# Patient Record
Sex: Female | Born: 1935 | Race: White | Hispanic: No | Smoking: Never smoker
Health system: Southern US, Community
[De-identification: ages and names within clinical notes are randomized; demographics above are authoritative.]

## PROBLEM LIST (undated history)

## (undated) DIAGNOSIS — K589 Irritable bowel syndrome without diarrhea: Secondary | ICD-10-CM

## (undated) DIAGNOSIS — M419 Scoliosis, unspecified: Secondary | ICD-10-CM

## (undated) DIAGNOSIS — F329 Major depressive disorder, single episode, unspecified: Secondary | ICD-10-CM

## (undated) DIAGNOSIS — K219 Gastro-esophageal reflux disease without esophagitis: Secondary | ICD-10-CM

## (undated) DIAGNOSIS — M1990 Unspecified osteoarthritis, unspecified site: Secondary | ICD-10-CM

## (undated) DIAGNOSIS — M5137 Other intervertebral disc degeneration, lumbosacral region: Secondary | ICD-10-CM

## (undated) DIAGNOSIS — J45909 Unspecified asthma, uncomplicated: Secondary | ICD-10-CM

## (undated) DIAGNOSIS — E785 Hyperlipidemia, unspecified: Secondary | ICD-10-CM

## (undated) DIAGNOSIS — M8580 Other specified disorders of bone density and structure, unspecified site: Secondary | ICD-10-CM

## (undated) DIAGNOSIS — K449 Diaphragmatic hernia without obstruction or gangrene: Secondary | ICD-10-CM

## (undated) DIAGNOSIS — M51379 Other intervertebral disc degeneration, lumbosacral region without mention of lumbar back pain or lower extremity pain: Secondary | ICD-10-CM

## (undated) DIAGNOSIS — G709 Myoneural disorder, unspecified: Secondary | ICD-10-CM

## (undated) DIAGNOSIS — M5136 Other intervertebral disc degeneration, lumbar region: Secondary | ICD-10-CM

## (undated) DIAGNOSIS — F419 Anxiety disorder, unspecified: Secondary | ICD-10-CM

## (undated) DIAGNOSIS — H269 Unspecified cataract: Secondary | ICD-10-CM

## (undated) DIAGNOSIS — H409 Unspecified glaucoma: Secondary | ICD-10-CM

## (undated) DIAGNOSIS — G43709 Chronic migraine without aura, not intractable, without status migrainosus: Secondary | ICD-10-CM

## (undated) DIAGNOSIS — K819 Cholecystitis, unspecified: Secondary | ICD-10-CM

## (undated) DIAGNOSIS — F32A Depression, unspecified: Secondary | ICD-10-CM

## (undated) DIAGNOSIS — C801 Malignant (primary) neoplasm, unspecified: Secondary | ICD-10-CM

## (undated) HISTORY — DX: Gastro-esophageal reflux disease without esophagitis: K21.9

## (undated) HISTORY — DX: Chronic migraine without aura, not intractable, without status migrainosus: G43.709

## (undated) HISTORY — DX: Unspecified cataract: H26.9

## (undated) HISTORY — DX: Unspecified glaucoma: H40.9

## (undated) HISTORY — DX: Scoliosis, unspecified: M41.9

## (undated) HISTORY — DX: Myoneural disorder, unspecified: G70.9

## (undated) HISTORY — DX: Depression, unspecified: F32.A

## (undated) HISTORY — DX: Diaphragmatic hernia without obstruction or gangrene: K44.9

## (undated) HISTORY — DX: Other intervertebral disc degeneration, lumbosacral region: M51.37

## (undated) HISTORY — DX: Cholecystitis, unspecified: K81.9

## (undated) HISTORY — DX: Other specified disorders of bone density and structure, unspecified site: M85.80

## (undated) HISTORY — PX: EYE SURGERY: SHX253

## (undated) HISTORY — PX: TONSILECTOMY, ADENOIDECTOMY, BILATERAL MYRINGOTOMY AND TUBES: SHX2538

## (undated) HISTORY — DX: Other intervertebral disc degeneration, lumbosacral region without mention of lumbar back pain or lower extremity pain: M51.379

## (undated) HISTORY — DX: Anxiety disorder, unspecified: F41.9

## (undated) HISTORY — DX: Unspecified osteoarthritis, unspecified site: M19.90

## (undated) HISTORY — PX: LEG SKIN LESION  BIOPSY / EXCISION: SUR473

## (undated) HISTORY — DX: Major depressive disorder, single episode, unspecified: F32.9

## (undated) HISTORY — DX: Malignant (primary) neoplasm, unspecified: C80.1

## (undated) HISTORY — DX: Other intervertebral disc degeneration, lumbar region: M51.36

## (undated) HISTORY — DX: Hyperlipidemia, unspecified: E78.5

## (undated) HISTORY — DX: Unspecified asthma, uncomplicated: J45.909

## (undated) HISTORY — PX: CHOLECYSTECTOMY: SHX55

## (undated) HISTORY — DX: Irritable bowel syndrome, unspecified: K58.9

## (undated) HISTORY — PX: TRABECULECTOMY: SHX107

---

## 2013-12-23 DIAGNOSIS — F419 Anxiety disorder, unspecified: Secondary | ICD-10-CM | POA: Insufficient documentation

## 2013-12-23 DIAGNOSIS — E785 Hyperlipidemia, unspecified: Secondary | ICD-10-CM | POA: Insufficient documentation

## 2013-12-23 DIAGNOSIS — H409 Unspecified glaucoma: Secondary | ICD-10-CM | POA: Insufficient documentation

## 2013-12-23 DIAGNOSIS — J45909 Unspecified asthma, uncomplicated: Secondary | ICD-10-CM | POA: Insufficient documentation

## 2014-03-03 DIAGNOSIS — K30 Functional dyspepsia: Secondary | ICD-10-CM | POA: Insufficient documentation

## 2014-03-03 DIAGNOSIS — K589 Irritable bowel syndrome without diarrhea: Secondary | ICD-10-CM | POA: Insufficient documentation

## 2014-03-03 DIAGNOSIS — H401190 Primary open-angle glaucoma, unspecified eye, stage unspecified: Secondary | ICD-10-CM | POA: Insufficient documentation

## 2014-03-03 DIAGNOSIS — J309 Allergic rhinitis, unspecified: Secondary | ICD-10-CM | POA: Insufficient documentation

## 2014-03-03 DIAGNOSIS — K2270 Barrett's esophagus without dysplasia: Secondary | ICD-10-CM | POA: Insufficient documentation

## 2014-03-03 DIAGNOSIS — R7301 Impaired fasting glucose: Secondary | ICD-10-CM | POA: Insufficient documentation

## 2014-03-03 DIAGNOSIS — M1711 Unilateral primary osteoarthritis, right knee: Secondary | ICD-10-CM | POA: Insufficient documentation

## 2014-03-03 DIAGNOSIS — G4489 Other headache syndrome: Secondary | ICD-10-CM | POA: Insufficient documentation

## 2014-03-03 DIAGNOSIS — F32A Depression, unspecified: Secondary | ICD-10-CM | POA: Insufficient documentation

## 2014-03-03 DIAGNOSIS — G43709 Chronic migraine without aura, not intractable, without status migrainosus: Secondary | ICD-10-CM | POA: Insufficient documentation

## 2014-03-03 DIAGNOSIS — K2100 Gastro-esophageal reflux disease with esophagitis, without bleeding: Secondary | ICD-10-CM | POA: Insufficient documentation

## 2014-03-03 DIAGNOSIS — H47019 Ischemic optic neuropathy, unspecified eye: Secondary | ICD-10-CM | POA: Insufficient documentation

## 2014-03-03 DIAGNOSIS — R110 Nausea: Secondary | ICD-10-CM | POA: Insufficient documentation

## 2014-03-03 DIAGNOSIS — M5430 Sciatica, unspecified side: Secondary | ICD-10-CM | POA: Insufficient documentation

## 2015-01-04 DIAGNOSIS — H53452 Other localized visual field defect, left eye: Secondary | ICD-10-CM | POA: Insufficient documentation

## 2015-02-16 DIAGNOSIS — M4125 Other idiopathic scoliosis, thoracolumbar region: Secondary | ICD-10-CM | POA: Insufficient documentation

## 2015-02-16 DIAGNOSIS — M5136 Other intervertebral disc degeneration, lumbar region: Secondary | ICD-10-CM | POA: Insufficient documentation

## 2015-07-08 DIAGNOSIS — H353290 Exudative age-related macular degeneration, unspecified eye, stage unspecified: Secondary | ICD-10-CM | POA: Insufficient documentation

## 2016-01-03 DIAGNOSIS — H52223 Regular astigmatism, bilateral: Secondary | ICD-10-CM | POA: Insufficient documentation

## 2016-01-03 DIAGNOSIS — H16223 Keratoconjunctivitis sicca, not specified as Sjogren's, bilateral: Secondary | ICD-10-CM | POA: Insufficient documentation

## 2016-01-03 DIAGNOSIS — E119 Type 2 diabetes mellitus without complications: Secondary | ICD-10-CM | POA: Insufficient documentation

## 2016-01-03 DIAGNOSIS — H5213 Myopia, bilateral: Secondary | ICD-10-CM | POA: Insufficient documentation

## 2016-01-03 DIAGNOSIS — H524 Presbyopia: Secondary | ICD-10-CM | POA: Insufficient documentation

## 2016-02-18 DIAGNOSIS — H2513 Age-related nuclear cataract, bilateral: Secondary | ICD-10-CM | POA: Insufficient documentation

## 2018-03-03 ENCOUNTER — Encounter: Payer: Self-pay | Admitting: Internal Medicine

## 2018-03-15 ENCOUNTER — Encounter: Payer: Self-pay | Admitting: Internal Medicine

## 2018-03-15 ENCOUNTER — Encounter (INDEPENDENT_AMBULATORY_CARE_PROVIDER_SITE_OTHER): Payer: Self-pay

## 2018-03-15 ENCOUNTER — Ambulatory Visit (INDEPENDENT_AMBULATORY_CARE_PROVIDER_SITE_OTHER): Payer: Medicare Other | Admitting: Internal Medicine

## 2018-03-15 VITALS — BP 126/80 | HR 72 | Ht 65.0 in | Wt 150.1 lb

## 2018-03-15 DIAGNOSIS — Z8719 Personal history of other diseases of the digestive system: Secondary | ICD-10-CM

## 2018-03-15 DIAGNOSIS — K582 Mixed irritable bowel syndrome: Secondary | ICD-10-CM

## 2018-03-15 DIAGNOSIS — K5900 Constipation, unspecified: Secondary | ICD-10-CM | POA: Diagnosis not present

## 2018-03-15 DIAGNOSIS — K219 Gastro-esophageal reflux disease without esophagitis: Secondary | ICD-10-CM | POA: Diagnosis not present

## 2018-03-15 MED ORDER — DICYCLOMINE HCL 10 MG PO CAPS: 10.0000 mg | ORAL_CAPSULE | Freq: Four times a day (QID) | ORAL | 3 refills | Status: DC | PRN

## 2018-03-15 NOTE — Patient Instructions (Addendum)
We have sent the following medications to your pharmacy for you to pick up at your convenience:  Dicylomine  Stay on your Pantoprazole twice daily.  Begin taking 1 tablespoon of Benefiber daily, working up to 2 tablespoons.   Call back in a month to let Dr. Hilarie Fredrickson know if you are doing better.    Please follow up on   04-30-2018 at 10:00am

## 2018-03-16 ENCOUNTER — Encounter: Payer: Self-pay | Admitting: Internal Medicine

## 2018-03-16 NOTE — Progress Notes (Signed)
Patient ID: Jennifer [name], female   DOB: 01-Nov-1933, 84 y.o.   MRN: [id number] HPI: Jennifer [name] is an 84 yo female with PMH of GERD, Barrett's esophagus, HH, colonic diverticulosis, internal hemorrhoid, remote colon polyps seen to evaluate alternating constipation and diarrhea with lower abdominal cramping.  She is here alone today.  She was been seen by GI MDs earlier this year and underwent an EGD (I do not have this report).  Reported this revealed severe esophagitis felt secondary to NSAIDs.  She also had an EGD and colonoscopy in Oct 2015 (Dr. Shana Chute).  Again I do not have these records available at the time of our office meeting.  She reports that she has both constipation and diarrhea.  Her constipation seems to start first and then lead to diarrhea.  She reports that she will have hard pellet like stools in the morning and this will progress to softer and more loose stool by the afternoon.  And then she will have 3 to 4 days without a bowel movement.  Prior to bowel movement she often has lower abdominal fullness and crampy discomfort.  She has a prescription for dicyclomine but she has been rationing them as the prescription is almost out.  She is tried Tums and Alka-Seltzer without much relief.  No blood in her stool or melena.  Eating seems to make her symptoms worse.  She is using pantoprazole 40 mg twice daily and reports no heartburn or reflux symptoms.  She was having some trouble swallowing earlier in the year which led to the upper endoscopy mentioned above.  All of her dysphagia has resolved.  There is no odynophagia.  She denies nausea and vomiting.  She does report having taken Excedrin and Aleve which led to upper endoscopy with the finding of esophagitis.  This was because she had tried to stop Fioricet for her migraine with aura.  Past Medical History:  Diagnosis Date  . Anxiety   . Asthma   . Chronic migraine without aura   . Degenerative disc disease at L5-S1 level   .  Depression   . Glaucoma   . Hyperlipidemia   . Irritable bowel   . Osteoarthritis     Past Surgical History:  Procedure Laterality Date  . EYE SURGERY    . TONSILECTOMY, ADENOIDECTOMY, BILATERAL MYRINGOTOMY AND TUBES      Outpatient Medications Prior to Visit  Medication Sig Dispense Refill  . Acetaminophen (TYLENOL EXTRA STRENGTH PO) Take by mouth daily as needed.    Marland Kitchen albuterol (PROVENTIL HFA;VENTOLIN HFA) 108 (90 Base) MCG/ACT inhaler Inhale into the lungs every 6 (six) hours as needed for wheezing or shortness of breath.    . Aspirin-Acetaminophen-Caffeine (EXCEDRIN PO) Take by mouth daily as needed.    . butalbital-acetaminophen-caffeine (FIORICET, ESGIC) 50-325-40 MG tablet Take 1 tablet by mouth 2 (two) times daily as needed for headache.    . diazepam (VALIUM) 10 MG tablet Take 10 mg by mouth every 6 (six) hours as needed for anxiety.    . Latanoprost 0.005 % EMUL Apply to eye daily.    . pantoprazole (PROTONIX) 40 MG tablet Take 40 mg by mouth 2 (two) times daily.    Marland Kitchen dicyclomine (BENTYL) 10 MG capsule Take 10 mg by mouth 4 (four) times daily -  before meals and at bedtime.     No facility-administered medications prior to visit.     Allergies  Allergen Reactions  . Erythromycin     Family History  Problem Relation  Age of Onset  . Diabetes Mother   . Stroke Mother   . Heart disease Father   . Diabetes Sister   . Diabetes Brother     Social History   Tobacco Use  . Smoking status: Never Smoker  . Smokeless tobacco: Never Used  Substance Use Topics  . Alcohol use: Never    Frequency: Never  . Drug use: Never    ROS: As per history of present illness, otherwise negative  BP 126/80   Pulse 72   Ht 5\' 5"  (1.651 m)   Wt 150 lb 2 oz (68.1 kg)   BMI 24.98 kg/m  Constitutional: Well-developed and well-nourished. No distress. HEENT: Normocephalic and atraumatic.  Conjunctivae are normal.  No scleral icterus. Neck: Neck supple. Trachea  midline. Cardiovascular: Normal rate, regular rhythm and intact distal pulses. No M/R/G Pulmonary/chest: Effort normal and breath sounds normal. No wheezing, rales or rhonchi. Abdominal: Soft, nontender, nondistended. Bowel sounds active throughout. There are no masses palpable.  Extremities: no clubbing, cyanosis, or edema Neurological: Alert and oriented to person place and time. Skin: Skin is warm and dry.  Psychiatric: Normal mood and affect. Behavior is normal.  RELEVANT LABS AND IMAGING: Care [facility] from [facility].  She had a normal CMP in April 2019 with the exception of a mildly elevated carbon dioxide at 32.  Total bili was 0.7, AST 19, ALT 11, alkaline phosphatase 112  ASSESSMENT/PLAN: 84 yo female with PMH of GERD, Barrett's esophagus, HH, colonic diverticulosis, internal hemorrhoid, remote colon polyps seen to evaluate alternating constipation and diarrhea with lower abdominal cramping.  We have requested her previous upper endoscopy and colonoscopy records along with any pathology results associated.  Review of these records will be needed for making further decisions and management.  1.  Alternating constipation and diarrhea --symptoms sound like constipation predominant irritable bowel.  She is having abdominal bloating after several days without bowel movement that then leads to hard stool followed by diarrhea.  There is associated lower abdominal cramping.  It is likely that improving constipation will improve abdominal bloating, lower abdominal pain and the constipation/diarrhea cycle she is on. --Begin Benefiber 1 working to 2 tablespoons daily --May need MiraLAX therapy depending on response --Can continue dicyclomine 10 mg 3-4 times daily as needed for lower abdominal crampy pain  2. GERD with history of esophagitis --esophagitis symptoms have resolved clinically.  Need to review the EGD from earlier this year.  Continue pantoprazole 40  mg twice daily AC for now.  Reported history of Barrett's esophagus though based on a GI note from earlier this year there was no Barrett's found at endoscopy in 2015.  3.  CRC screening --age 36, review colonoscopy requested.  Remote colon polyps.  Can discuss at follow-up  Return in January 2020   Cc: PCP

## 2018-04-30 ENCOUNTER — Ambulatory Visit (HOSPITAL_COMMUNITY)
Admission: RE | Admit: 2018-04-30 | Discharge: 2018-04-30 | Disposition: A | Discharge status: Home / Self Care | Payer: Medicare Other | Source: Ambulatory Visit | Attending: Internal Medicine | Admitting: Internal Medicine

## 2018-04-30 ENCOUNTER — Encounter: Payer: Self-pay | Admitting: Internal Medicine

## 2018-04-30 ENCOUNTER — Other Ambulatory Visit (HOSPITAL_COMMUNITY)
Admission: RE | Admit: 2018-04-30 | Discharge: 2018-04-30 | Disposition: A | Discharge status: Home / Self Care | Payer: Medicare Other | Attending: Internal Medicine | Admitting: Internal Medicine

## 2018-04-30 ENCOUNTER — Encounter (HOSPITAL_COMMUNITY): Payer: Self-pay

## 2018-04-30 ENCOUNTER — Telehealth: Payer: Self-pay | Admitting: Internal Medicine

## 2018-04-30 ENCOUNTER — Ambulatory Visit (INDEPENDENT_AMBULATORY_CARE_PROVIDER_SITE_OTHER): Payer: Medicare Other | Admitting: Internal Medicine

## 2018-04-30 VITALS — BP 124/60 | HR 70 | Ht 66.0 in | Wt 147.6 lb

## 2018-04-30 DIAGNOSIS — R109 Unspecified abdominal pain: Secondary | ICD-10-CM | POA: Insufficient documentation

## 2018-04-30 DIAGNOSIS — K582 Mixed irritable bowel syndrome: Secondary | ICD-10-CM

## 2018-04-30 DIAGNOSIS — R112 Nausea with vomiting, unspecified: Secondary | ICD-10-CM

## 2018-04-30 DIAGNOSIS — R1013 Epigastric pain: Secondary | ICD-10-CM | POA: Insufficient documentation

## 2018-04-30 LAB — CBC WITH DIFFERENTIAL/PLATELET
Abs Immature Granulocytes: 0.04 10*3/uL (ref 0.00–0.07)
Basophils Absolute: 0.1 10*3/uL (ref 0.0–0.1)
Basophils Relative: 1 %
Eosinophils Absolute: 0.1 10*3/uL (ref 0.0–0.5)
Eosinophils Relative: 2 %
HCT: 45.4 % (ref 36.0–46.0)
Hemoglobin: 14.7 g/dL (ref 12.0–15.0)
Immature Granulocytes: 1 %
Lymphocytes Relative: 12 %
Lymphs Abs: 0.8 10*3/uL (ref 0.7–4.0)
MCH: 32.1 pg (ref 26.0–34.0)
MCHC: 32.4 g/dL (ref 30.0–36.0)
MCV: 99.1 fL (ref 80.0–100.0)
Monocytes Absolute: 0.8 10*3/uL (ref 0.1–1.0)
Monocytes Relative: 12 %
NRBC: 0 % (ref 0.0–0.2)
Neutro Abs: 5 10*3/uL (ref 1.7–7.7)
Neutrophils Relative %: 72 %
Platelets: 259 10*3/uL (ref 150–400)
RBC: 4.58 MIL/uL (ref 3.87–5.11)
RDW: 12.8 % (ref 11.5–15.5)
WBC: 6.8 10*3/uL (ref 4.0–10.5)

## 2018-04-30 LAB — COMPREHENSIVE METABOLIC PANEL
ALT: 9 U/L (ref 0–44)
AST: 21 U/L (ref 15–41)
Albumin: 4.5 g/dL (ref 3.5–5.0)
Alkaline Phosphatase: 88 U/L (ref 38–126)
Anion gap: 11 (ref 5–15)
BUN: 10 mg/dL (ref 8–23)
CO2: 31 mmol/L (ref 22–32)
Calcium: 9.4 mg/dL (ref 8.9–10.3)
Chloride: 102 mmol/L (ref 98–111)
Creatinine, Ser: 0.7 mg/dL (ref 0.44–1.00)
GFR calc Af Amer: >60 mL/min (ref >60)
GFR calc non Af Amer: >60 mL/min (ref >60)
Glucose, Bld: 96 mg/dL (ref 70–99)
Potassium: 4.1 mmol/L (ref 3.5–5.1)
Sodium: 144 mmol/L (ref 135–145)
Total Bilirubin: 0.6 mg/dL (ref 0.3–1.2)
Total Protein: 7.8 g/dL (ref 6.5–8.1)

## 2018-04-30 LAB — LIPASE, BLOOD: Lipase: 29 U/L (ref 11–51)

## 2018-04-30 MED ORDER — IOHEXOL 300 MG/ML  SOLN
100.0000 mL | Freq: Once | INTRAMUSCULAR | Status: AC | PRN
  Administered 2018-04-30: 100 mL via INTRAVENOUS

## 2018-04-30 MED ORDER — DICYCLOMINE HCL 20 MG PO TABS: 20.0000 mg | ORAL_TABLET | Freq: Three times a day (TID) | ORAL | 1 refills | Status: DC

## 2018-04-30 MED ORDER — SODIUM CHLORIDE (PF) 0.9 % IJ SOLN
INTRAMUSCULAR | Status: AC
  Filled 2018-04-30: qty 50

## 2018-04-30 NOTE — Progress Notes (Signed)
Subjective:    Patient ID: Jennifer [name], female    DOB: 07-29-1933, 85 y.o.   MRN: [id number]  HPI Jennifer [name] is an 85 year old female with a history of GERD, Barrett's esophagus, hiatal hernia, colonic diverticulosis, internal hemorrhoids, remote colon polyps who is seen in follow-up.  She was last seen on 03/15/2018.  She is here alone today.  Today she reports that she "cannot eat food".  About 10 days ago she decided that she would try to eat and ate a meal consisting of cabbage, potatoes and navy beans.  Some hours later she developed intense pain from her chest all the way through her lower abdomen and into her back.  Pain also seem to be more located in the right upper quadrant and right back.  Associated with just diffuse difficult for her to describe discomfort.  She tried dicyclomine x2 with no help, Alka-Seltzer with no help.  She eventually became nauseous and had vomiting of nearly her entire meal.  She found it difficult to sleep.  Symptoms abated by about 10 AM the next day.  She also tried MiraLAX without benefit.  She did not have a bowel movement during this episode thus no diarrhea.  She is not had a similar episode since but is been only eating runny oat meals and crackers primarily once per day.  She is taking pantoprazole 40 mg in the morning.  She was using Benefiber 1 tablespoon and had increased to 2 tablespoons but had noticed slightly too frequent of bowel movement.  She has backed off on this slightly and her bowel movements most recently have been fairly regular but smaller volume as she is not eating as much.  No blood in her stool or melena.  She intermittently tries dicyclomine but the 20 mg dose seems to work better than the 10 mg dose.  When describing the episode occurring 10 days ago she became quite tearful and upset.  Review of Systems As per HPI, otherwise negative  Current Medications, Allergies, Past Medical History, Past Surgical History, Family  History and Social History were reviewed in [employer] record.     Objective:   Physical Exam BP 124/60   Pulse 70   Ht 5\' 6"  (1.676 m)   Wt 147 lb 9.6 oz (67 kg)   SpO2 97%   BMI 23.82 kg/m  Constitutional: Well-developed and well-nourished. No distress. HEENT: Normocephalic and atraumatic.  Conjunctivae are normal.  No scleral icterus. Neck: Neck supple. Trachea midline. Cardiovascular: Normal rate, regular rhythm and intact distal pulses. No M/R/G Pulmonary/chest: Effort normal and breath sounds normal. No wheezing, rales or rhonchi. Abdominal: Soft, nontender, nondistended. Bowel sounds active throughout. There are no masses palpable. No hepatosplenomegaly. Extremities: no clubbing, cyanosis, or edema Neurological: Alert and oriented to person place and time. Skin: Skin is warm and dry. Psychiatric: Normal mood and affect. Behavior is normal.      Assessment & Plan:  85 year old female with a history of GERD, Barrett's esophagus, hiatal hernia, colonic diverticulosis, internal hemorrhoids, remote colon polyps who is seen in follow-up.   1.  Abdominal pain/IBS/GERD with history of esophagitis--episodic abdominal pain.  Unclear etiology.  Query gallbladder dysfunction or gallstone disease.  I recommended CBC, CMP lipase, proceed with CT scan of the abdomen pelvis with IV contrast to further evaluate this symptom.  If unremarkable may need to consider HIDA scan and upper endoscopy. --CT scan and labs as above --Continue pantoprazole 40 mg once daily --Continue Benefiber 1  heaping tablespoon daily to help regulate bowel movements and control constipation and diarrhea which she has been prone to in the past (though not an issue today) --Increase dicyclomine to 20 mg every 8 hours as needed  Further recommendations after labs and imaging 25 minutes spent with the patient today. Greater than 50% was spent in counseling and coordination of care with the  patient

## 2018-04-30 NOTE — Telephone Encounter (Signed)
Jennifer [name] from [facility] called in and advised that the pt was their for sched appt from dr. Hilarie Fredrickson. She advised that the pat did not have any lab results on the order sheet and they can not do the test. She is needing a nurse to call back soon as possible to assit in the matter. thanks

## 2018-04-30 NOTE — Patient Instructions (Addendum)
If you are age 83 or older, your body mass index should be between 23-30. Your Body mass index is 23.82 kg/m. If this is out of the aforementioned range listed, please consider follow up with your Primary Care Provider.  Your provider has requested that you go to the basement level for lab work before leaving today. Press "B" on the elevator. The lab is located at the first door on the left as you exit the elevator.  ___________________________________________________ Jennifer [name] have been scheduled for a CT scan of the abdomen and pelvis at [facility]   You are scheduled on TODAY at 4:00 pm. You should arrive 15 minutes prior to your appointment time for registration. Please follow the written instructions below on the day of your exam:  WARNING: IF YOU ARE ALLERGIC TO IODINE/X-RAY DYE, PLEASE NOTIFY RADIOLOGY IMMEDIATELY AT [phone]! YOU WILL BE GIVEN A 13 HOUR PREMEDICATION PREP.  1) Do not eat or drink anything after 12:00 pm (4 hours prior to your test) 2) You have been given 2 bottles of oral contrast to drink. The solution may taste better if refrigerated, but do NOT add ice or any other liquid to this solution. Shake well before drinking.    Drink 1 bottle of contrast @ 2:00 pm (2 hours prior to your exam)  Drink 1 bottle of contrast @ 3:00 pm (1 hour prior to your exam)  You may take any medications as prescribed with a small amount of water, if necessary. If you take any of the following medications: METFORMIN, GLUCOPHAGE, GLUCOVANCE, AVANDAMET, RIOMET, FORTAMET, [place] MET, JANUMET, GLUMETZA or METAGLIP, you MAY be asked to HOLD this medication 48 hours AFTER the exam.  The purpose of you drinking the oral contrast is to aid in the visualization of your intestinal tract. The contrast solution may cause some diarrhea. Depending on your individual set of symptoms, you may also receive an intravenous injection of x-ray contrast/dye. Plan on being at  [facility] for 30 minutes or longer, depending on the type of exam you are having performed.  This test typically takes 30-45 minutes to complete.  If you have any questions regarding your exam or if you need to reschedule, you may call the CT department at [phone] between the hours of 8:00 am and 5:00 pm, Monday-Friday.  _______________________________________________________ We have sent the following medications to your pharmacy for you to pick up at your convenience:  Dicyclomine 20 mg every 8 hours

## 2018-04-30 NOTE — Telephone Encounter (Signed)
I spoke to Jennifer [name] and the pt had not gotten labs as requested when leaving our office.  She was taken to the hospital lab and had the labs drawn and will be getting her test shortly.

## 2018-05-04 ENCOUNTER — Telehealth: Payer: Self-pay | Admitting: Internal Medicine

## 2018-05-04 NOTE — Telephone Encounter (Signed)
Pt called stating that [facility] has not called her yet to schedule appointment. Pt said that she spoke with [facility] yesterday and was told that she would received a call today.

## 2018-05-04 NOTE — Telephone Encounter (Signed)
[facility] has called pt and are waiting on her to call them back to see if she can make the appt on 05/06/18 @9am  with Dr. Dalbert Batman.

## 2018-05-04 NOTE — Telephone Encounter (Signed)
Sent message to [facility] to check on referral.

## 2018-05-04 NOTE — Telephone Encounter (Signed)
Pt called again regarding this message. I let her know that we are working on this. She stated that [facility] wanted her to have test this week, pt states that her son can bring her to appt this week but if it is not this week then it will have to be postponed for long time.

## 2018-05-07 ENCOUNTER — Ambulatory Visit: Payer: Self-pay | Admitting: Surgery

## 2018-05-07 NOTE — H&P (View-Only) (Signed)
Surgical H&P  CC: abdominal pain  HPI:  This is a 85 year old woman with a history of GERD, Barrett's esophagus, hiatal hernia, colonic diverticulosis, hemorrhoids and remote colon polyps who is referred by Dr. Hilarie Fredrickson for consideration of cholecystectomy. She has numerous gastroenterological complaints which appear to be chronic, and states that for the last 1.5 years she has had intermittent bouts of pain echo from her sternum all the way down to her lower abdomen related to eating larger meals.  Recently she has had more frequent and longer lasting attacks with postprandial epigastric and lower chest pain as well as right upper quadrant pain associated with nausea.  She is tried Bentyl, Alka-Seltzer, improving her bowel regimen, but nothing has alleviated the pain, the last attack lasted throughout the evening and until 10:00 the next day, having started around 7 PM on her last meal was around 3 PM.  She also complains of a feeling like she can eat because food stacks up in her esophagus. She does take pantoprazole 40 mg daily, does use a fiber supplement and noticed that her bowel movements have been fairly regularly, no diarrhea, melena or hematochezia. She underwent CMP and CBC on January 10 both of which were within normal limits. She had a CT scan the same day that does show cholelithiasis with mild diffuse gallbladder wall thickening suspicious for cholecystitis although there was no stranding or secondary signs of cholecystitis.  No biliary ductal dilatation.  She was noted to have colonic diverticulosis without evidence of diverticulitis. No prior abdominal surgery. Denies any history of heart or lung disease besides mild asthma.  Allergies  Allergen Reactions  . Erythromycin     Past Medical History:  Diagnosis Date  . Anxiety   . Asthma   . Chronic migraine without aura   . Degenerative disc disease at L5-S1 level   . Depression   . Glaucoma   . Hyperlipidemia   . Irritable bowel    . Osteoarthritis     Past Surgical History:  Procedure Laterality Date  . EYE SURGERY    . TONSILECTOMY, ADENOIDECTOMY, BILATERAL MYRINGOTOMY AND TUBES      Family History  Problem Relation Age of Onset  . Diabetes Mother   . Stroke Mother   . Heart disease Father   . Diabetes Sister   . Diabetes Brother     Social History   Socioeconomic History  . Marital status: Widowed    Spouse name: Not on file  . Number of children: 1  . Years of education: Not on file  . Highest education level: Not on file  Occupational History  . Not on file  Social Needs  . Financial resource strain: Not on file  . Food insecurity:    Worry: Not on file    Inability: Not on file  . Transportation needs:    Medical: Not on file    Non-medical: Not on file  Tobacco Use  . Smoking status: Never Smoker  . Smokeless tobacco: Never Used  Substance and Sexual Activity  . Alcohol use: Never    Frequency: Never  . Drug use: Never  . Sexual activity: Not on file  Lifestyle  . Physical activity:    Days per week: Not on file    Minutes per session: Not on file  . Stress: Not on file  Relationships  . Social connections:    Talks on phone: Not on file    Gets together: Not on file    Attends  religious service: Not on file    Active member of club or organization: Not on file    Attends meetings of clubs or organizations: Not on file    Relationship status: Not on file  Other Topics Concern  . Not on file  Social History Narrative  . Not on file    Current Outpatient Medications on File Prior to Visit  Medication Sig Dispense Refill  . Acetaminophen (TYLENOL EXTRA STRENGTH PO) Take by mouth daily as needed.    Marland Kitchen albuterol (PROVENTIL HFA;VENTOLIN HFA) 108 (90 Base) MCG/ACT inhaler Inhale into the lungs every 6 (six) hours as needed for wheezing or shortness of breath.    . Aspirin-Acetaminophen-Caffeine (EXCEDRIN PO) Take by mouth daily as needed.    .  butalbital-acetaminophen-caffeine (FIORICET, ESGIC) 50-325-40 MG tablet Take 1 tablet by mouth 2 (two) times daily as needed for headache.    . diazepam (VALIUM) 10 MG tablet Take 10 mg by mouth every 6 (six) hours as needed for anxiety.    . dicyclomine (BENTYL) 20 MG tablet Take 1 tablet (20 mg total) by mouth every 8 (eight) hours. 90 tablet 1  . Latanoprost 0.005 % EMUL Apply to eye daily.    . pantoprazole (PROTONIX) 40 MG tablet Take 40 mg by mouth 2 (two) times daily.     No current facility-administered medications on file prior to visit.     Review of Systems: a complete, 10pt review of systems was completed with pertinent positives and negatives as documented in the HPI  Physical Exam: There were no vitals filed for this visit. Gen: A&Ox3, no distress. Her son is with her here today. Head: normocephalic, atraumatic Eyes: extraocular motions intact, anicteric.  Neck: supple without mass or thyromegaly Chest: unlabored respirations, symmetrical air entry, clear bilaterally   Cardiovascular: RRR with palpable distal pulses, no pedal edema Abdomen: soft, nondistended, nontender. No mass or organomegaly.  Extremities: warm, without edema, no deformities  Neuro: grossly intact Psych: appropriate mood and affect, normal insight  Skin: warm and dry   CBC Latest Ref Rng & Units 04/30/2018  WBC 4.0 - 10.5 K/uL 6.8  Hemoglobin 12.0 - 15.0 g/dL 14.7  Hematocrit 36.0 - 46.0 % 45.4  Platelets 150 - 400 K/uL 259    CMP Latest Ref Rng & Units 04/30/2018  Glucose 70 - 99 mg/dL 96  BUN 8 - 23 mg/dL 10  Creatinine 0.44 - 1.00 mg/dL 0.70  Sodium 135 - 145 mmol/L 144  Potassium 3.5 - 5.1 mmol/L 4.1  Chloride 98 - 111 mmol/L 102  CO2 22 - 32 mmol/L 31  Calcium 8.9 - 10.3 mg/dL 9.4  Total Protein 6.5 - 8.1 g/dL 7.8  Total Bilirubin 0.3 - 1.2 mg/dL 0.6  Alkaline Phos 38 - 126 U/L 88  AST 15 - 41 U/L 21  ALT 0 - 44 U/L 9    No results found for: INR, PROTIME  Imaging: CT  04/30/17: CLINICAL DATA:  Worsening generalized abdominal pain and nausea and vomiting for 3 months.  EXAM: CT ABDOMEN AND PELVIS WITH CONTRAST  TECHNIQUE: Multidetector CT imaging of the abdomen and pelvis was performed using the standard protocol following bolus administration of intravenous contrast.  CONTRAST:  17mL OMNIPAQUE IOHEXOL 300 MG/ML  SOLN  COMPARISON:  None.  FINDINGS: Lower Chest: No acute findings.  Hepatobiliary: No hepatic masses identified. A few tiny sub-cm cysts are seen in the left lobe.  A sub-cm gallstone is seen in the gallbladder neck. The gallbladder is mildly  distended and shows mild diffuse wall thickening, although there is no evidence of pericholecystic inflammatory changes or fluid. No evidence of biliary ductal dilatation.  Pancreas:  No mass or inflammatory changes.  Spleen: Within normal limits in size. A few tiny splenic cysts are noted.  Adrenals/Urinary Tract: No masses identified. No evidence of hydronephrosis.  Stomach/Bowel: Diverticulosis is seen mainly involving the sigmoid colon, however there is no evidence of diverticulitis.  Vascular/Lymphatic: No pathologically enlarged lymph nodes. No abdominal aortic aneurysm. Aortic atherosclerosis.  Reproductive:  No mass or other significant abnormality.  Other:  None.  Musculoskeletal: No suspicious bone lesions identified. Lumbar degenerative spondylosis and levoscoliosis noted.  IMPRESSION: 1. Cholelithiasis and mild diffuse gallbladder wall thickening, suspicious for cholecystitis. Consider nuclear medicine hepatobiliary scan for further evaluation if clinically warranted. 2. No evidence of biliary ductal dilatation. 3. Colonic diverticulosis, without radiographic evidence of diverticulitis.   Electronically Signed   By: Earle Gell M.D.   On: 04/30/2018 17:18   A/P: biliary colic versus cholecystitis. We had a very long discussion today regarding  her multiple GI complaints.  I do suspect that the postprandial pain originates from a biliary etiology, although I did inform the patient that there is significant likelihood that she will continue to have issues with her reflux and esophageal problems as well as her irritable bowel syndrome and I would not expect these to change significantly following, her surgery. I recommend proceeding with laparoscopic cholecystectomy with possible cholangiogram. Discussed risks of surgery including bleeding, pain, scarring, intraabdominal injury specifically to the common bile duct and sequelae, conversion to open surgery, blood clot, pneumonia, heart attack, stroke, failure to resolve symptoms, etc. We discussed the alternative of nonoperative management with a low-fat diet although it sounds like she is essentially are doing this and continues to struggle with symptoms. Questions welcomed and answered. She would like to proceed with surgery    Romana Juniper, MD [facility], [place] Pager [phone]

## 2018-05-07 NOTE — Patient Instructions (Addendum)
Niti Leisure  05/07/2018   Your procedure is scheduled on: 05-11-2018  Report to [facility] Main  Entrance   Report to admitting at 1215 PM    Call this number if you have problems the morning of surgery [phone]    Remember: Do not eat food:After Midnight. MAY HAVE CLEAR LIQUIDS FROM MIDNIGHT UNTIL 815 AM DAY OF SURGERY. NOTHING BY MOUTH AFTER 815 AM DAY OF SURGERY. BRUSH YOUR TEETH MORNING OF SURGERY AND RINSE YOUR MOUTH OUT, NO CHEWING GUM CANDY OR MINTS.     CLEAR LIQUID DIET   Foods Allowed                                                                     Foods Excluded  Coffee and tea, regular and decaf                             liquids that you cannot  Plain Jell-O in any flavor                                             see through such as: Fruit ices (not with fruit pulp)                                     milk, soups, orange juice  Iced Popsicles                                    All solid food Carbonated beverages, regular and diet                                    Cranberry, grape and apple juices Sports drinks like Gatorade Lightly seasoned clear broth or consume(fat free) Sugar, honey syrup  Sample Menu Breakfast                                Lunch                                     Supper Cranberry juice                    Beef broth                            Chicken broth Jell-O                                     Grape juice  Apple juice Coffee or tea                        Jell-O                                      Popsicle                                                Coffee or tea                        Coffee or tea  _____________________________________________________________________     Take these medicines the morning of surgery with A SIP OF WATER: DIAZEPAM (VALIUM), ALBUTEROL INHALER IF NEEDED AND BRING INHALER, PANTAPRAZOLE (PROTONIX)                               You may not  have any metal on your body including hair pins and              piercings  Do not wear jewelry, make-up, lotions, powders or perfumes, deodorant             Do not wear nail polish.  Do not shave  48 hours prior to surgery.                 Do not bring valuables to the hospital. [place].  Contacts, dentures or bridgework may not be worn into surgery.  Leave suitcase in the car. After surgery it may be brought to your room.     Patients discharged the day of surgery will not be allowed to drive home. IF YOU ARE HAVING SURGERY AND GOING HOME THE SAME DAY, YOU MUST HAVE AN ADULT TO DRIVE YOU HOME AND BE WITH YOU FOR 24 HOURS. YOU MAY GO HOME BY TAXI OR UBER OR ORTHERWISE, BUT AN ADULT MUST ACCOMPANY YOU HOME AND STAY WITH YOU FOR 24 HOURS.  Name and phone number of your driver:  Special Instructions: N/A              Please read over the following fact sheets you were given: _____________________________________________________________________  [facility] - Preparing for Surgery Before surgery, you can play an important role.  Because skin is not sterile, your skin needs to be as free of germs as possible.  You can reduce the number of germs on your skin by washing with CHG (chlorahexidine gluconate) soap before surgery.  CHG is an antiseptic cleaner which kills germs and bonds with the skin to continue killing germs even after washing. Please DO NOT use if you have an allergy to CHG or antibacterial soaps.  If your skin becomes reddened/irritated stop using the CHG and inform your nurse when you arrive at Short Stay. Do not shave (including legs and underarms) for at least 48 hours prior to the first CHG shower.  You may shave your face/neck. Please follow these instructions carefully:  1.  Shower with CHG Soap the night before surgery and the  morning of Surgery.  2.  If you  choose to wash your hair, wash your hair first as usual with your   normal  shampoo.  3.  After you shampoo, rinse your hair and body thoroughly to remove the  shampoo.                           4.  Use CHG as you would any other liquid soap.  You can apply chg directly  to the skin and wash                       Gently with a scrungie or clean washcloth.  5.  Apply the CHG Soap to your body ONLY FROM THE NECK DOWN.   Do not use on face/ open                           Wound or open sores. Avoid contact with eyes, ears mouth and genitals (private parts).                       Wash face,  Genitals (private parts) with your normal soap.             6.  Wash thoroughly, paying special attention to the area where your surgery  will be performed.  7.  Thoroughly rinse your body with warm water from the neck down.  8.  DO NOT shower/wash with your normal soap after using and rinsing off  the CHG Soap.                9.  Pat yourself dry with a clean towel.            10.  Wear clean pajamas.            11.  Place clean sheets on your bed the night of your first shower and do not  sleep with pets. Day of Surgery : Do not apply any lotions/deodorants the morning of surgery.  Please wear clean clothes to the hospital/surgery center.  FAILURE TO FOLLOW THESE INSTRUCTIONS MAY RESULT IN THE CANCELLATION OF YOUR SURGERY PATIENT SIGNATURE_________________________________  NURSE SIGNATURE__________________________________  ________________________________________________________________________

## 2018-05-07 NOTE — H&P (Signed)
Surgical H&P  CC: abdominal pain  HPI:  This is a 85 year old woman with a history of GERD, Barrett's esophagus, hiatal hernia, colonic diverticulosis, hemorrhoids and remote colon polyps who is referred by Dr. Hilarie Fredrickson for consideration of cholecystectomy. She has numerous gastroenterological complaints which appear to be chronic, and states that for the last 1.5 years she has had intermittent bouts of pain echo from her sternum all the way down to her lower abdomen related to eating larger meals.  Recently she has had more frequent and longer lasting attacks with postprandial epigastric and lower chest pain as well as right upper quadrant pain associated with nausea.  She is tried Bentyl, Alka-Seltzer, improving her bowel regimen, but nothing has alleviated the pain, the last attack lasted throughout the evening and until 10:00 the next day, having started around 7 PM on her last meal was around 3 PM.  She also complains of a feeling like she can eat because food stacks up in her esophagus. She does take pantoprazole 40 mg daily, does use a fiber supplement and noticed that her bowel movements have been fairly regularly, no diarrhea, melena or hematochezia. She underwent CMP and CBC on January 10 both of which were within normal limits. She had a CT scan the same day that does show cholelithiasis with mild diffuse gallbladder wall thickening suspicious for cholecystitis although there was no stranding or secondary signs of cholecystitis.  No biliary ductal dilatation.  She was noted to have colonic diverticulosis without evidence of diverticulitis. No prior abdominal surgery. Denies any history of heart or lung disease besides mild asthma.  Allergies  Allergen Reactions  . Erythromycin     Past Medical History:  Diagnosis Date  . Anxiety   . Asthma   . Chronic migraine without aura   . Degenerative disc disease at L5-S1 level   . Depression   . Glaucoma   . Hyperlipidemia   . Irritable bowel    . Osteoarthritis     Past Surgical History:  Procedure Laterality Date  . EYE SURGERY    . TONSILECTOMY, ADENOIDECTOMY, BILATERAL MYRINGOTOMY AND TUBES      Family History  Problem Relation Age of Onset  . Diabetes Mother   . Stroke Mother   . Heart disease Father   . Diabetes Sister   . Diabetes Brother     Social History   Socioeconomic History  . Marital status: Widowed    Spouse name: Not on file  . Number of children: 1  . Years of education: Not on file  . Highest education level: Not on file  Occupational History  . Not on file  Social Needs  . Financial resource strain: Not on file  . Food insecurity:    Worry: Not on file    Inability: Not on file  . Transportation needs:    Medical: Not on file    Non-medical: Not on file  Tobacco Use  . Smoking status: Never Smoker  . Smokeless tobacco: Never Used  Substance and Sexual Activity  . Alcohol use: Never    Frequency: Never  . Drug use: Never  . Sexual activity: Not on file  Lifestyle  . Physical activity:    Days per week: Not on file    Minutes per session: Not on file  . Stress: Not on file  Relationships  . Social connections:    Talks on phone: Not on file    Gets together: Not on file    Attends  religious service: Not on file    Active member of club or organization: Not on file    Attends meetings of clubs or organizations: Not on file    Relationship status: Not on file  Other Topics Concern  . Not on file  Social History Narrative  . Not on file    Current Outpatient Medications on File Prior to Visit  Medication Sig Dispense Refill  . Acetaminophen (TYLENOL EXTRA STRENGTH PO) Take by mouth daily as needed.    Marland Kitchen albuterol (PROVENTIL HFA;VENTOLIN HFA) 108 (90 Base) MCG/ACT inhaler Inhale into the lungs every 6 (six) hours as needed for wheezing or shortness of breath.    . Aspirin-Acetaminophen-Caffeine (EXCEDRIN PO) Take by mouth daily as needed.    .  butalbital-acetaminophen-caffeine (FIORICET, ESGIC) 50-325-40 MG tablet Take 1 tablet by mouth 2 (two) times daily as needed for headache.    . diazepam (VALIUM) 10 MG tablet Take 10 mg by mouth every 6 (six) hours as needed for anxiety.    . dicyclomine (BENTYL) 20 MG tablet Take 1 tablet (20 mg total) by mouth every 8 (eight) hours. 90 tablet 1  . Latanoprost 0.005 % EMUL Apply to eye daily.    . pantoprazole (PROTONIX) 40 MG tablet Take 40 mg by mouth 2 (two) times daily.     No current facility-administered medications on file prior to visit.     Review of Systems: a complete, 10pt review of systems was completed with pertinent positives and negatives as documented in the HPI  Physical Exam: There were no vitals filed for this visit. Gen: A&Ox3, no distress. Her son is with her here today. Head: normocephalic, atraumatic Eyes: extraocular motions intact, anicteric.  Neck: supple without mass or thyromegaly Chest: unlabored respirations, symmetrical air entry, clear bilaterally   Cardiovascular: RRR with palpable distal pulses, no pedal edema Abdomen: soft, nondistended, nontender. No mass or organomegaly.  Extremities: warm, without edema, no deformities  Neuro: grossly intact Psych: appropriate mood and affect, normal insight  Skin: warm and dry   CBC Latest Ref Rng & Units 04/30/2018  WBC 4.0 - 10.5 K/uL 6.8  Hemoglobin 12.0 - 15.0 g/dL 14.7  Hematocrit 36.0 - 46.0 % 45.4  Platelets 150 - 400 K/uL 259    CMP Latest Ref Rng & Units 04/30/2018  Glucose 70 - 99 mg/dL 96  BUN 8 - 23 mg/dL 10  Creatinine 0.44 - 1.00 mg/dL 0.70  Sodium 135 - 145 mmol/L 144  Potassium 3.5 - 5.1 mmol/L 4.1  Chloride 98 - 111 mmol/L 102  CO2 22 - 32 mmol/L 31  Calcium 8.9 - 10.3 mg/dL 9.4  Total Protein 6.5 - 8.1 g/dL 7.8  Total Bilirubin 0.3 - 1.2 mg/dL 0.6  Alkaline Phos 38 - 126 U/L 88  AST 15 - 41 U/L 21  ALT 0 - 44 U/L 9    No results found for: INR, PROTIME  Imaging: CT  04/30/17: CLINICAL DATA:  Worsening generalized abdominal pain and nausea and vomiting for 3 months.  EXAM: CT ABDOMEN AND PELVIS WITH CONTRAST  TECHNIQUE: Multidetector CT imaging of the abdomen and pelvis was performed using the standard protocol following bolus administration of intravenous contrast.  CONTRAST:  116mL OMNIPAQUE IOHEXOL 300 MG/ML  SOLN  COMPARISON:  None.  FINDINGS: Lower Chest: No acute findings.  Hepatobiliary: No hepatic masses identified. A few tiny sub-cm cysts are seen in the left lobe.  A sub-cm gallstone is seen in the gallbladder neck. The gallbladder is mildly  distended and shows mild diffuse wall thickening, although there is no evidence of pericholecystic inflammatory changes or fluid. No evidence of biliary ductal dilatation.  Pancreas:  No mass or inflammatory changes.  Spleen: Within normal limits in size. A few tiny splenic cysts are noted.  Adrenals/Urinary Tract: No masses identified. No evidence of hydronephrosis.  Stomach/Bowel: Diverticulosis is seen mainly involving the sigmoid colon, however there is no evidence of diverticulitis.  Vascular/Lymphatic: No pathologically enlarged lymph nodes. No abdominal aortic aneurysm. Aortic atherosclerosis.  Reproductive:  No mass or other significant abnormality.  Other:  None.  Musculoskeletal: No suspicious bone lesions identified. Lumbar degenerative spondylosis and levoscoliosis noted.  IMPRESSION: 1. Cholelithiasis and mild diffuse gallbladder wall thickening, suspicious for cholecystitis. Consider nuclear medicine hepatobiliary scan for further evaluation if clinically warranted. 2. No evidence of biliary ductal dilatation. 3. Colonic diverticulosis, without radiographic evidence of diverticulitis.   Electronically Signed   By: Earle Gell M.D.   On: 04/30/2018 17:18   A/P: biliary colic versus cholecystitis. We had a very long discussion today regarding  her multiple GI complaints.  I do suspect that the postprandial pain originates from a biliary etiology, although I did inform the patient that there is significant likelihood that she will continue to have issues with her reflux and esophageal problems as well as her irritable bowel syndrome and I would not expect these to change significantly following, her surgery. I recommend proceeding with laparoscopic cholecystectomy with possible cholangiogram. Discussed risks of surgery including bleeding, pain, scarring, intraabdominal injury specifically to the common bile duct and sequelae, conversion to open surgery, blood clot, pneumonia, heart attack, stroke, failure to resolve symptoms, etc. We discussed the alternative of nonoperative management with a low-fat diet although it sounds like she is essentially are doing this and continues to struggle with symptoms. Questions welcomed and answered. She would like to proceed with surgery    Romana Juniper, MD [facility], [place] Pager [phone]

## 2018-05-10 ENCOUNTER — Other Ambulatory Visit: Payer: Self-pay

## 2018-05-10 ENCOUNTER — Encounter (HOSPITAL_COMMUNITY): Payer: Self-pay

## 2018-05-10 ENCOUNTER — Encounter (HOSPITAL_COMMUNITY)
Admission: RE | Admit: 2018-05-10 | Discharge: 2018-05-10 | Disposition: A | Discharge status: Home / Self Care | Payer: Medicare Other | Source: Ambulatory Visit | Attending: Surgery | Admitting: Surgery

## 2018-05-10 ENCOUNTER — Ambulatory Visit (HOSPITAL_COMMUNITY)
Admission: RE | Admit: 2018-05-10 | Discharge: 2018-05-10 | Disposition: A | Discharge status: Home / Self Care | Payer: Medicare Other | Source: Ambulatory Visit | Attending: Surgery | Admitting: Surgery

## 2018-05-10 DIAGNOSIS — Z01810 Encounter for preprocedural cardiovascular examination: Secondary | ICD-10-CM | POA: Diagnosis not present

## 2018-05-10 MED ORDER — CHLORHEXIDINE GLUCONATE 4 % EX LIQD
60.0000 mL | Freq: Once | CUTANEOUS | Status: DC
  Filled 2018-05-10: qty 60

## 2018-05-10 MED ORDER — BUPIVACAINE LIPOSOME 1.3 % IJ SUSP
20.0000 mL | Freq: Once | INTRAMUSCULAR | Status: DC
  Filled 2018-05-10: qty 20

## 2018-05-10 NOTE — Progress Notes (Signed)
04/30/2018- noted in Epic- CBC w/diff., CMP

## 2018-05-11 ENCOUNTER — Ambulatory Visit (HOSPITAL_COMMUNITY)
Admission: RE | Admit: 2018-05-11 | Discharge: 2018-05-11 | Disposition: A | Discharge status: Home / Self Care | Payer: Medicare Other | Source: Ambulatory Visit | Attending: Surgery | Admitting: Surgery

## 2018-05-11 ENCOUNTER — Ambulatory Visit (HOSPITAL_COMMUNITY): Payer: Medicare Other | Admitting: Anesthesiology

## 2018-05-11 ENCOUNTER — Encounter (HOSPITAL_COMMUNITY)
Admission: RE | Disposition: A | Discharge status: Home / Self Care | Payer: Self-pay | Source: Ambulatory Visit | Attending: Surgery

## 2018-05-11 ENCOUNTER — Encounter (HOSPITAL_COMMUNITY): Payer: Self-pay | Admitting: *Deleted

## 2018-05-11 DIAGNOSIS — G43709 Chronic migraine without aura, not intractable, without status migrainosus: Secondary | ICD-10-CM | POA: Insufficient documentation

## 2018-05-11 DIAGNOSIS — H409 Unspecified glaucoma: Secondary | ICD-10-CM | POA: Insufficient documentation

## 2018-05-11 DIAGNOSIS — Z79899 Other long term (current) drug therapy: Secondary | ICD-10-CM | POA: Insufficient documentation

## 2018-05-11 DIAGNOSIS — Z881 Allergy status to other antibiotic agents status: Secondary | ICD-10-CM | POA: Insufficient documentation

## 2018-05-11 DIAGNOSIS — F419 Anxiety disorder, unspecified: Secondary | ICD-10-CM | POA: Insufficient documentation

## 2018-05-11 DIAGNOSIS — K589 Irritable bowel syndrome without diarrhea: Secondary | ICD-10-CM | POA: Diagnosis not present

## 2018-05-11 DIAGNOSIS — K219 Gastro-esophageal reflux disease without esophagitis: Secondary | ICD-10-CM | POA: Insufficient documentation

## 2018-05-11 DIAGNOSIS — J45909 Unspecified asthma, uncomplicated: Secondary | ICD-10-CM | POA: Insufficient documentation

## 2018-05-11 DIAGNOSIS — K8010 Calculus of gallbladder with chronic cholecystitis without obstruction: Secondary | ICD-10-CM | POA: Diagnosis not present

## 2018-05-11 DIAGNOSIS — K8050 Calculus of bile duct without cholangitis or cholecystitis without obstruction: Secondary | ICD-10-CM | POA: Diagnosis present

## 2018-05-11 SURGERY — CHOLECYSTECTOMY, ROBOT-ASSISTED, LAPAROSCOPIC
Anesthesia: General | Site: Abdomen

## 2018-05-11 MED ORDER — BUPIVACAINE-EPINEPHRINE 0.25% -1:200000 IJ SOLN
INTRAMUSCULAR | Status: DC | PRN
  Administered 2018-05-11: 30 mL

## 2018-05-11 MED ORDER — FENTANYL CITRATE (PF) 100 MCG/2ML IJ SOLN
INTRAMUSCULAR | Status: DC | PRN
  Administered 2018-05-11: 50 ug via INTRAVENOUS
  Administered 2018-05-11: 100 ug via INTRAVENOUS
  Administered 2018-05-11: 50 ug via INTRAVENOUS

## 2018-05-11 MED ORDER — INDOCYANINE GREEN 25 MG IV SOLR
2.5000 mg | Freq: Once | INTRAVENOUS | Status: AC
  Administered 2018-05-11: 2.5 mg via INTRAVENOUS
  Filled 2018-05-11: qty 25

## 2018-05-11 MED ORDER — DOCUSATE SODIUM 100 MG PO CAPS: 100.0000 mg | ORAL_CAPSULE | Freq: Two times a day (BID) | ORAL | 0 refills | Status: AC

## 2018-05-11 MED ORDER — DEXAMETHASONE SODIUM PHOSPHATE 10 MG/ML IJ SOLN
INTRAMUSCULAR | Status: DC | PRN
  Administered 2018-05-11: 10 mg via INTRAVENOUS

## 2018-05-11 MED ORDER — ONDANSETRON HCL 4 MG/2ML IJ SOLN
INTRAMUSCULAR | Status: DC | PRN
  Administered 2018-05-11: 4 mg via INTRAVENOUS

## 2018-05-11 MED ORDER — ROCURONIUM BROMIDE 100 MG/10ML IV SOLN
INTRAVENOUS | Status: AC
  Filled 2018-05-11: qty 1

## 2018-05-11 MED ORDER — CEFAZOLIN SODIUM-DEXTROSE 2-4 GM/100ML-% IV SOLN
2.0000 g | INTRAVENOUS | Status: AC
  Administered 2018-05-11: 2 g via INTRAVENOUS
  Filled 2018-05-11: qty 100

## 2018-05-11 MED ORDER — GABAPENTIN 300 MG PO CAPS
300.0000 mg | ORAL_CAPSULE | ORAL | Status: AC
  Administered 2018-05-11: 300 mg via ORAL
  Filled 2018-05-11: qty 1

## 2018-05-11 MED ORDER — SUGAMMADEX SODIUM 200 MG/2ML IV SOLN
INTRAVENOUS | Status: DC | PRN
  Administered 2018-05-11: 150 mg via INTRAVENOUS

## 2018-05-11 MED ORDER — FENTANYL CITRATE (PF) 250 MCG/5ML IJ SOLN
INTRAMUSCULAR | Status: AC
  Filled 2018-05-11: qty 5

## 2018-05-11 MED ORDER — 0.9 % SODIUM CHLORIDE (POUR BTL) OPTIME
TOPICAL | Status: DC | PRN
  Administered 2018-05-11: 1000 mL

## 2018-05-11 MED ORDER — ONDANSETRON HCL 4 MG/2ML IJ SOLN
INTRAMUSCULAR | Status: AC
  Filled 2018-05-11: qty 2

## 2018-05-11 MED ORDER — PROPOFOL 10 MG/ML IV BOLUS
INTRAVENOUS | Status: DC | PRN
  Administered 2018-05-11: 100 mg via INTRAVENOUS

## 2018-05-11 MED ORDER — TRAMADOL HCL 50 MG PO TABS: 50.0000 mg | ORAL_TABLET | Freq: Four times a day (QID) | ORAL | 0 refills | Status: AC | PRN

## 2018-05-11 MED ORDER — ROCURONIUM BROMIDE 100 MG/10ML IV SOLN
INTRAVENOUS | Status: DC | PRN
  Administered 2018-05-11 (×2): 5 mg via INTRAVENOUS
  Administered 2018-05-11: 50 mg via INTRAVENOUS
  Administered 2018-05-11: 10 mg via INTRAVENOUS

## 2018-05-11 MED ORDER — PROPOFOL 10 MG/ML IV BOLUS
INTRAVENOUS | Status: AC
  Filled 2018-05-11: qty 20

## 2018-05-11 MED ORDER — PROMETHAZINE HCL 25 MG/ML IJ SOLN
INTRAMUSCULAR | Status: AC
  Filled 2018-05-11: qty 1

## 2018-05-11 MED ORDER — BUTALBITAL-APAP-CAFFEINE 50-325-40 MG PO TABS
1.0000 | ORAL_TABLET | Freq: Once | ORAL | Status: AC
  Administered 2018-05-11: 1 via ORAL
  Filled 2018-05-11: qty 1

## 2018-05-11 MED ORDER — FENTANYL CITRATE (PF) 100 MCG/2ML IJ SOLN
25.0000 ug | INTRAMUSCULAR | Status: DC | PRN
  Administered 2018-05-11: 50 ug via INTRAVENOUS

## 2018-05-11 MED ORDER — LACTATED RINGERS IV SOLN
INTRAVENOUS | Status: DC
  Administered 2018-05-11: 09:00:00 via INTRAVENOUS

## 2018-05-11 MED ORDER — BUPIVACAINE-EPINEPHRINE (PF) 0.25% -1:200000 IJ SOLN
INTRAMUSCULAR | Status: AC
  Filled 2018-05-11: qty 30

## 2018-05-11 MED ORDER — PHENYLEPHRINE HCL 10 MG/ML IJ SOLN
INTRAMUSCULAR | Status: DC | PRN
  Administered 2018-05-11: 40 ug via INTRAVENOUS

## 2018-05-11 MED ORDER — LACTATED RINGERS IR SOLN
Status: DC | PRN
  Administered 2018-05-11: 1000 mL

## 2018-05-11 MED ORDER — ONDANSETRON HCL 4 MG/2ML IJ SOLN
4.0000 mg | Freq: Once | INTRAMUSCULAR | Status: AC | PRN
  Administered 2018-05-11: 4 mg via INTRAVENOUS

## 2018-05-11 MED ORDER — ACETAMINOPHEN 500 MG PO TABS
1000.0000 mg | ORAL_TABLET | ORAL | Status: AC
  Administered 2018-05-11: 1000 mg via ORAL
  Filled 2018-05-11: qty 2

## 2018-05-11 MED ORDER — PROMETHAZINE HCL 25 MG/ML IJ SOLN
6.2500 mg | Freq: Once | INTRAMUSCULAR | Status: AC
  Administered 2018-05-11: 6.25 mg via INTRAVENOUS

## 2018-05-11 MED ORDER — BUPIVACAINE LIPOSOME 1.3 % IJ SUSP
INTRAMUSCULAR | Status: DC | PRN
  Administered 2018-05-11: 20 mL

## 2018-05-11 MED ORDER — AMOXICILLIN-POT CLAVULANATE 875-125 MG PO TABS: 1.0000 | ORAL_TABLET | Freq: Two times a day (BID) | ORAL | 0 refills | Status: AC

## 2018-05-11 MED ORDER — FENTANYL CITRATE (PF) 100 MCG/2ML IJ SOLN
INTRAMUSCULAR | Status: AC
  Filled 2018-05-11: qty 2

## 2018-05-11 SURGICAL SUPPLY — 47 items
APPLIER CLIP ROT 10 11.4 M/L (STAPLE)
CABLE HIGH FREQUENCY MONO STRZ (ELECTRODE) IMPLANT
CHLORAPREP W/TINT 26ML (MISCELLANEOUS) ×4 IMPLANT
CLIP APPLIE ROT 10 11.4 M/L (STAPLE) IMPLANT
CLIP VESOLOCK MED LG 6/CT (CLIP) ×4 IMPLANT
COVER MAYO STAND STRL (DRAPES) IMPLANT
COVER SURGICAL LIGHT HANDLE (MISCELLANEOUS) ×4 IMPLANT
COVER TIP SHEARS 8 DVNC (MISCELLANEOUS) ×2 IMPLANT
COVER TIP SHEARS 8MM DA VINCI (MISCELLANEOUS) ×2
COVER WAND RF STERILE (DRAPES) ×4 IMPLANT
DECANTER SPIKE VIAL GLASS SM (MISCELLANEOUS) IMPLANT
DERMABOND ADVANCED (GAUZE/BANDAGES/DRESSINGS) ×2
DERMABOND ADVANCED .7 DNX12 (GAUZE/BANDAGES/DRESSINGS) ×2 IMPLANT
DRAPE ARM DVNC X/XI (DISPOSABLE) ×6 IMPLANT
DRAPE C-ARM 42X120 X-RAY (DRAPES) IMPLANT
DRAPE COLUMN DVNC XI (DISPOSABLE) ×2 IMPLANT
DRAPE DA VINCI XI ARM (DISPOSABLE) ×6
DRAPE DA VINCI XI COLUMN (DISPOSABLE) ×2
DRAPE UNIVERSAL PACK (DRAPES) ×4 IMPLANT
ELECT REM PT RETURN 15FT ADLT (MISCELLANEOUS) ×4 IMPLANT
GLOVE BIO SURGEON STRL SZ 6 (GLOVE) ×4 IMPLANT
GLOVE INDICATOR 6.5 STRL GRN (GLOVE) ×4 IMPLANT
GOWN STRL REUS W/TWL LRG LVL3 (GOWN DISPOSABLE) ×4 IMPLANT
GOWN STRL REUS W/TWL XL LVL3 (GOWN DISPOSABLE) ×12 IMPLANT
GRASPER SUT TROCAR 14GX15 (MISCELLANEOUS) ×4 IMPLANT
HEMOSTAT SNOW SURGICEL 2X4 (HEMOSTASIS) IMPLANT
KIT BASIN OR (CUSTOM PROCEDURE TRAY) ×4 IMPLANT
NEEDLE INSUFFLATION 14GA 120MM (NEEDLE) IMPLANT
POUCH SPECIMEN RETRIEVAL 10MM (ENDOMECHANICALS) IMPLANT
SCISSORS LAP 5X35 DISP (ENDOMECHANICALS) ×4 IMPLANT
SEAL CANN UNIV 5-8 DVNC XI (MISCELLANEOUS) ×8 IMPLANT
SEAL XI 5MM-8MM UNIVERSAL (MISCELLANEOUS) ×8
SET CHOLANGIOGRAPH MIX (MISCELLANEOUS) IMPLANT
SET IRRIG TUBING LAPAROSCOPIC (IRRIGATION / IRRIGATOR) ×4 IMPLANT
SET TUBE SMOKE EVAC HIGH FLOW (TUBING) IMPLANT
SLEEVE XCEL OPT CAN 5 100 (ENDOMECHANICALS) IMPLANT
SOLUTION ELECTROLUBE (MISCELLANEOUS) ×4 IMPLANT
SUT MNCRL AB 4-0 PS2 18 (SUTURE) ×4 IMPLANT
SUT VICRYL 0 UR6 27IN ABS (SUTURE) ×4 IMPLANT
SYS RETRIEVAL 5MM INZII UNIV (BASKET) ×4
SYSTEM RETRIEVL 5MM INZII UNIV (BASKET) ×2 IMPLANT
TOWEL OR 17X26 10 PK STRL BLUE (TOWEL DISPOSABLE) ×4 IMPLANT
TOWEL OR NON WOVEN STRL DISP B (DISPOSABLE) IMPLANT
TRAY LAPAROSCOPIC (CUSTOM PROCEDURE TRAY) ×4 IMPLANT
TROCAR BLADELESS OPT 5 100 (ENDOMECHANICALS) ×4 IMPLANT
TROCAR XCEL 12X100 BLDLESS (ENDOMECHANICALS) ×4 IMPLANT
TUBING INSUFFLATION (TUBING) ×4 IMPLANT

## 2018-05-11 NOTE — Op Note (Signed)
Operative Note  Jennifer [name] 85 y.o. female [id number]  05/11/2018  Surgeon: Clovis Riley MD  Assistant: Gurney Maxin MD  Procedure performed: Robotic Cholecystectomy  Preop diagnosis: biliary colic Post-op diagnosis/intraop findings: CHOLECYSTITIS   Specimens: gallbladder  EBL: 11BJ  Complications: none  Description of procedure: After obtaining informed consent the patient was brought to the operating room. Prophylactic antibiotics were administered. SCD's were applied. General endotracheal anesthesia was initiated and a formal time-out was performed. The abdomen was prepped and draped in the usual sterile fashion and the abdomen was entered using visiport technique in the left upper quadrant after instilling the site with local. Insufflation to 74mmHg was obtained and gross inspection revealed no evidence of injury from our entry or other intraabdominal abnormalities. 59mm trocars were introduced in the periumbilical, right midclavicular and right anterior axillary lines under direct visualization and following infiltration with local. Entry port upsized to 23mm trocar. The gallbladder was visualized- it was adherent to the adjacent omentum, distended, thickened and pale consistent with acute cholecystitis. Omental adhesions were carefully taken down with cautery and blunt dissection. The gallbladder was then retracted cephalad and the infundibulum was retracted laterally. A combination of hook electrocautery and blunt dissection was utilized to clear the peritoneum from the neck and cystic duct, circumferentially isolating the cystic artery and cystic duct and lifting the gallbladder from the cystic plate. The critical view of safety was achieved with the cystic artery, cystic duct, and liver bed visualized between them with no other structures. Firefly was employed and confirmed the visualized anatomy. The artery was clipped with a single hemolock clip proximally and distally and  divided as was the cystic duct with two clips on the proximal end. The gallbladder was dissected from the liver plate using electrocautery. The gallbladder was densely adherent with obliteration of the plane between the gallbladder and the liver bed. The gallbladder wall did tear in a couple places during the dissection from the liver bed spilling purulent bile and small stones. Once freed the gallbladder was placed in an endocatch bag and removed through the left upper quadrant trocar site. A small amount of bleeding on the liver bed was controlled with cautery. The bile and small stones were aspirated and the right upper quadrant was irrigated copiously until the effluent was clear. Hemostasis was once again confirmed, and reinspection of the abdomen revealed no injuries. The clips were well opposed without any bile leak from the duct or the liver bed. Firefly was again used and no bile leak was present. The extraction site in the left upper quadrant was closed with a 0 vicryl in the fascia under direct visualization using a PMI device. The abdomen was desufflated and all trocars removed. The skin incisions were closed with running subcuticular monocryl and Dermabond. The patient was awakened, extubated and transported to the recovery room in stable condition.   All counts were correct at the completion of the case.

## 2018-05-11 NOTE — Anesthesia Preprocedure Evaluation (Addendum)
Anesthesia Evaluation  Patient identified by MRN, date of birth, ID band Patient awake    Reviewed: Allergy & Precautions, NPO status , Patient's Chart, lab work & pertinent test results  Airway Mallampati: II  TM Distance: <3 FB Neck ROM: Full    Dental  (+) Teeth Intact, Dental Advisory Given, Caps, Upper Dentures Lower teeth are capped :   Pulmonary asthma ,    Pulmonary exam normal breath sounds clear to auscultation       Cardiovascular negative cardio ROS Normal cardiovascular exam Rhythm:Regular Rate:Normal     Neuro/Psych  Headaches, PSYCHIATRIC DISORDERS Anxiety Depression    GI/Hepatic GERD  Medicated,CHOLECYSTITIS   Endo/Other  negative endocrine ROS  Renal/GU negative Renal ROS     Musculoskeletal  (+) Arthritis ,   Abdominal   Peds  Hematology negative hematology ROS (+)   Anesthesia Other Findings Day of surgery medications reviewed with the patient.  Reproductive/Obstetrics                            Anesthesia Physical Anesthesia Plan  ASA: III  Anesthesia Plan: General   Post-op Pain Management:    Induction: Intravenous  PONV Risk Score and Plan: 4 or greater and Dexamethasone, Ondansetron and Treatment may vary due to age or medical condition  Airway Management Planned: Oral ETT  Additional Equipment:   Intra-op Plan:   Post-operative Plan: Extubation in OR  Informed Consent: I have reviewed the patients History and Physical, chart, labs and discussed the procedure including the risks, benefits and alternatives for the proposed anesthesia with the patient or authorized representative who has indicated his/her understanding and acceptance.     Dental advisory given  Plan Discussed with: CRNA  Anesthesia Plan Comments:         Anesthesia Quick Evaluation

## 2018-05-11 NOTE — Anesthesia Procedure Notes (Signed)
Procedure Name: Intubation Date/Time: 05/11/2018 9:45 AM Performed by: Glory Buff, CRNA Pre-anesthesia Checklist: Patient identified, Emergency Drugs available, Suction available and Patient being monitored Patient Re-evaluated:Patient Re-evaluated prior to induction Oxygen Delivery Method: Circle system utilized Preoxygenation: Pre-oxygenation with 100% oxygen Induction Type: IV induction Ventilation: Mask ventilation without difficulty Laryngoscope Size: Miller and 3 Grade View: Grade I Tube type: Oral Tube size: 7.0 mm Number of attempts: 1 Airway Equipment and Method: Stylet and Oral airway Placement Confirmation: ETT inserted through vocal cords under direct vision,  positive ETCO2 and breath sounds checked- equal and bilateral Secured at: 20 cm Tube secured with: Tape Dental Injury: Teeth and Oropharynx as per pre-operative assessment

## 2018-05-11 NOTE — Discharge Instructions (Signed)
POST OP INSTRUCTIONS  ######################################################################  EAT Gradually transition to a high fiber diet with a fiber supplement over the next few weeks after discharge.  Start with a pureed / full liquid diet (see below)  WALK Walk an hour a day.  Control your pain to do that.    CONTROL PAIN Control pain so that you can walk, sleep, tolerate sneezing/coughing, go up/down stairs.  HAVE A BOWEL MOVEMENT DAILY Keep your bowels regular to avoid problems.  OK to try a laxative to override constipation.  OK to use an antidairrheal to slow down diarrhea.  Call if not better after 2 tries  CALL IF YOU HAVE PROBLEMS/CONCERNS Call if you are still struggling despite following these instructions. Call if you have concerns not answered by these instructions  ######################################################################    1. DIET: Follow a light bland diet the first 24 hours after arrival home, such as soup, liquids, crackers, etc.  Be sure to include lots of fluids daily.  Avoid fast food or heavy meals as your are more likely to get nauseated.  Eat a low fat the next few days after surgery.    2. Take your usually prescribed home medications unless otherwise directed.  3. PAIN CONTROL: a. Pain is best controlled by a usual combination of three different methods TOGETHER: i. Ice/Heat ii. Over the counter pain medication iii. Prescription pain medication b. Most patients will experience some swelling and bruising around the incisions.  Ice packs or heating pads (30-60 minutes up to 6 times a day) will help. Use ice for the first few days to help decrease swelling and bruising, then switch to heat to help relax tight/sore spots and speed recovery.  Some people prefer to use ice alone, heat alone, alternating between ice & heat.  Experiment to what works for you.  Swelling and bruising can take several weeks to resolve.   c. It is helpful to take an  over-the-counter pain medication regularly for the first few weeks.  i. Naproxen (Aleve, etc)  Two 220mg  tabs twice a day OR Ibuprofen (Advil, etc) Three 200mg  tabs four times a day (every meal & bedtime) AND ii. Acetaminophen (Tylenol, etc) 500-650mg  four times a day (every meal & bedtime) d. A  prescription for pain medication (such as oxycodone, hydrocodone, tramadol, gabapentin, methocarbamol, etc) should be given to you upon discharge.  Take your pain medication as prescribed  IF NEEDED.  i. If you are having problems/concerns with the prescription medicine (does not control pain, nausea, vomiting, rash, itching, etc), please call us [phone] to see if we need to switch you to a different pain medicine that will work better for you and/or control your side effect better. ii. If you need a refill on your pain medication, please give [place] 48 hour notice.  contact your pharmacy.  They will contact our office to request authorization. Prescriptions will not be filled after 5 pm or on week-ends  4. Avoid getting constipated.   a. Between the surgery and the pain medications, it is common to experience some constipation.   b. Increasing fluid intake and taking a fiber supplement (such as Metamucil, Citrucel, FiberCon, MiraLax, etc) 1-2 times a day regularly will usually help prevent this problem from occurring.   c. A mild laxative (prune juice, Milk of Magnesia, MiraLax, etc) should be taken according to package directions if there are no bowel movements after 48 hours.   5. Watch out for diarrhea.   a. If you have many  loose bowel movements, simplify your diet to bland foods & liquids for a few days.   b. Stop any stool softeners and decrease your fiber supplement.   c. Switching to mild anti-diarrheal medications (Kayopectate, Pepto Bismol) can help.   d. If this worsens or does not improve, please call us.  6. Wash / shower every day.  You may shower over the skin glue which waterproof.   Continue to shower over incision(s) after the dressing is off.  7. Skin glue will flake off after 2 weeks.  You may leave the incision open to air.  You may replace a dressing/Band-Aid to cover the incision for comfort if you wish.   8. ACTIVITIES as tolerated:   a. You may resume regular (light) daily activities beginning the next day--such as daily self-care, walking, climbing stairs--gradually increasing activities as tolerated.  If you can walk 30 minutes without difficulty, it is safe to try more intense activity such as jogging, treadmill, bicycling, low-impact aerobics, swimming, etc. b. Save the most intensive and strenuous activity for last such as sit-ups, heavy lifting, contact sports, etc  Refrain from any heavy lifting or straining until you are off narcotics for pain control.   c. DO NOT PUSH THROUGH PAIN.  Let pain be your guide: If it hurts to do something, don't do it.  Pain is your body warning you to avoid that activity for another week until the pain goes down. d. You may drive when you are no longer taking prescription pain medication, you can comfortably wear a seatbelt, and you can safely maneuver your car and apply brakes. e. Dennis Bast may have sexual intercourse when it is comfortable.  9. FOLLOW UP in our office a. Please call [facility] at [phone] to set up an appointment to see your surgeon in the office for a follow-up appointment approximately 2-3 weeks after your surgery. b. Make sure that you call for this appointment the day you arrive home to insure a convenient appointment time.  10. IF YOU HAVE DISABILITY OR FAMILY LEAVE FORMS, BRING THEM TO THE OFFICE FOR PROCESSING.  DO NOT GIVE THEM TO YOUR DOCTOR.   WHEN TO CALL us [phone]: 1. Poor pain control 2. Reactions / problems with new medications (rash/itching, nausea, etc)  3. Fever over 101.5 F (38.5 C) 4. Inability to urinate 5. Nausea and/or vomiting 6. Worsening swelling or bruising 7. Continued bleeding  from incision. 8. Increased pain, redness, or drainage from the incision   The clinic staff is available to answer your questions during regular business hours (8:30am-5pm).  Please dont hesitate to call and ask to speak to one of our nurses for clinical concerns.   If you have a medical emergency, go to the nearest emergency room or call 911.  A surgeon from [facility] is always on call at the [facility], [place], [place], [place], [place]  [address] ? MAIN: [phone] ? TOLL FREE: [phone] ?  FAX [phone] [email or web address]

## 2018-05-11 NOTE — Transfer of Care (Signed)
Immediate Anesthesia Transfer of Care Note  Patient: Jennifer [name]  Procedure(s) Performed: XI ROBOTIC ASSISTED CHOLECYSTECTOMY (N/A Abdomen)  Patient Location: PACU  Anesthesia Type:General  Level of Consciousness: awake, alert  and oriented  Airway & Oxygen Therapy: Patient Spontanous Breathing and Patient connected to face mask oxygen  Post-op Assessment: Report given to RN and Post -op Vital signs reviewed and stable  Post vital signs: Reviewed and stable  Last Vitals:  Vitals Value Taken Time  BP 145/74 05/11/2018 11:45 AM  Temp    Pulse 88 05/11/2018 11:47 AM  Resp 20 05/11/2018 11:47 AM  SpO2 100 % 05/11/2018 11:47 AM  Vitals shown include unvalidated device data.  Last Pain:  Vitals:   05/11/18 0830  TempSrc: Oral         Complications: No apparent anesthesia complications

## 2018-05-11 NOTE — Interval H&P Note (Signed)
History and Physical Interval Note:  05/11/2018 9:03 AM  Jennifer [name]  has presented today for surgery, with the diagnosis of CHOLECYSTITIS  The various methods of treatment have been discussed with the patient and family. After consideration of risks, benefits and other options for treatment, the patient has consented to  Procedure(s): XI ROBOTIC [place] (N/A) as a surgical intervention .  The patient's history has been reviewed, patient examined, no change in status, stable for surgery.  I have reviewed the patient's chart and labs.  Questions were answered to the patient's satisfaction.     [name]

## 2018-05-12 NOTE — Anesthesia Postprocedure Evaluation (Signed)
Anesthesia Post Note  Patient: Regulatory affairs officer  Procedure(s) Performed: XI ROBOTIC ASSISTED CHOLECYSTECTOMY (N/A Abdomen)     Patient location during evaluation: PACU Anesthesia Type: General Level of consciousness: awake and alert Pain management: pain level controlled Vital Signs Assessment: post-procedure vital signs reviewed and stable Respiratory status: spontaneous breathing, nonlabored ventilation, respiratory function stable and patient connected to nasal cannula oxygen Cardiovascular status: blood pressure returned to baseline and stable Postop Assessment: no apparent nausea or vomiting Anesthetic complications: no    Last Vitals:  Vitals:   05/11/18 1500 05/11/18 1528  BP: (!) 116/59 118/60  Pulse: 86 87  Resp: (!) 21 20  Temp: (!) 36.4 C   SpO2: 98% 98%    Last Pain:  Vitals:   05/11/18 1528  TempSrc:   PainSc: 0-No pain                 Catalina Gravel

## 2018-11-08 ENCOUNTER — Telehealth: Payer: Self-pay | Admitting: Internal Medicine

## 2018-11-08 MED ORDER — PANTOPRAZOLE SODIUM 40 MG PO TBEC: 40.0000 mg | DELAYED_RELEASE_TABLET | Freq: Two times a day (BID) | ORAL | 2 refills | Status: DC

## 2018-11-08 NOTE — Telephone Encounter (Signed)
Pt requested Dr. Hilarie Fredrickson to prescribe pantoprazole--previous pt at [facility] and MD there had prescribed the drug, but pt is close to being out.   Please update pt's pharmacy to [facility] on [address]. [phone]

## 2018-11-08 NOTE — Telephone Encounter (Signed)
Per Dr Vena Rua last office note, patient is to take pantoprazole. I will send refills.

## 2018-11-23 ENCOUNTER — Telehealth: Payer: Self-pay | Admitting: General Practice

## 2018-11-23 NOTE — Telephone Encounter (Signed)
Primary Information  Source Subject Topic  [name], Jennifer (Patient) Jennifer [name] (Patient) Appointment Scheduling - New Patient  Summary: new Patient  New patient would like approval and to be scheduled for your office.  Provider: DR. Penni Homans or Dr. Lamar Blinks   Date of Appointment:   Pt states she doesn't need much attention but she has been wearing compression socks and is experiencing painful spot on one leg that hurts to the touch. Pt would like to stay with providers in [place] and with [place]     Route to department's PEC pool.  Patient Information  Patient Name Gender DOB SSN  Jennifer, [name] Female [birth year] [id number]  Notes  Jennifer [name] 11/23/2018 03:02 PM  Summary: general    Spoke to pt who states she has a knot on her left leg about twice the size of her thumb. It is warm to the touch and discolored. She has sharp shooting pains in the area. Pt states she has been to a PA at the office of her former PCP but he retired. She said the PA prescribed Keflex and an antibiotic ointment. She said last Friday they did a punch test to get a wound culture. She is taking Tylenol for the pain. Pt advised first available new pt appt with LBSW is Friday 8/7. She states she has to check with her son first because he would have to bring her. Pt was advised to contact the PA that prescribed current trx for further eval.

## 2019-04-11 ENCOUNTER — Other Ambulatory Visit: Payer: Self-pay | Admitting: Internal Medicine

## 2019-04-12 ENCOUNTER — Telehealth: Payer: Self-pay | Admitting: Internal Medicine

## 2019-04-12 MED ORDER — PANTOPRAZOLE SODIUM 40 MG PO TBEC: 40.0000 mg | DELAYED_RELEASE_TABLET | Freq: Two times a day (BID) | ORAL | 0 refills | Status: DC

## 2019-04-12 NOTE — Telephone Encounter (Signed)
Rx sent. Need appt for further refills.

## 2019-04-12 NOTE — Telephone Encounter (Signed)
Patient called requesting refill on Protonix to be sent to [facility].

## 2019-10-18 ENCOUNTER — Ambulatory Visit: Payer: Medicare Other | Admitting: Internal Medicine

## 2019-12-12 ENCOUNTER — Telehealth: Payer: Self-pay | Admitting: Internal Medicine

## 2019-12-12 NOTE — Telephone Encounter (Signed)
Pt is requesting a refill on her medication pantoprazole, pt was told she needed an appt before refill, she is scheduled for 10/20 with Dr Silverio Decamp, pt wants to know if she could get some medication till her appt

## 2019-12-12 NOTE — Telephone Encounter (Signed)
I dont see a doctor transfer from [place] to [place]  Was this approved for her to be on Nandigams schedule

## 2019-12-13 MED ORDER — PANTOPRAZOLE SODIUM 40 MG PO TBEC: 40.0000 mg | DELAYED_RELEASE_TABLET | Freq: Two times a day (BID) | ORAL | 1 refills | Status: DC

## 2019-12-13 NOTE — Telephone Encounter (Signed)
Jennifer [name], sorry I meant she's scheduled with Dr Hilarie Fredrickson.

## 2019-12-13 NOTE — Telephone Encounter (Signed)
Rx sent 

## 2019-12-20 ENCOUNTER — Encounter: Payer: Self-pay | Admitting: *Deleted

## 2020-01-13 ENCOUNTER — Encounter: Payer: Self-pay | Admitting: *Deleted

## 2020-02-08 ENCOUNTER — Ambulatory Visit (INDEPENDENT_AMBULATORY_CARE_PROVIDER_SITE_OTHER): Payer: Medicare Other | Admitting: Internal Medicine

## 2020-02-08 ENCOUNTER — Encounter: Payer: Self-pay | Admitting: Internal Medicine

## 2020-02-08 VITALS — BP 148/66 | HR 64 | Ht 65.0 in | Wt 142.0 lb

## 2020-02-08 DIAGNOSIS — K219 Gastro-esophageal reflux disease without esophagitis: Secondary | ICD-10-CM | POA: Diagnosis not present

## 2020-02-08 DIAGNOSIS — Z0000 Encounter for general adult medical examination without abnormal findings: Secondary | ICD-10-CM | POA: Diagnosis not present

## 2020-02-08 DIAGNOSIS — K222 Esophageal obstruction: Secondary | ICD-10-CM | POA: Diagnosis not present

## 2020-02-08 DIAGNOSIS — K30 Functional dyspepsia: Secondary | ICD-10-CM

## 2020-02-08 DIAGNOSIS — R1319 Other dysphagia: Secondary | ICD-10-CM

## 2020-02-08 DIAGNOSIS — K582 Mixed irritable bowel syndrome: Secondary | ICD-10-CM

## 2020-02-08 DIAGNOSIS — K449 Diaphragmatic hernia without obstruction or gangrene: Secondary | ICD-10-CM

## 2020-02-08 MED ORDER — DIAZEPAM 5 MG PO TABS: 5.0000 mg | ORAL_TABLET | Freq: Every evening | ORAL | 0 refills | Status: DC | PRN

## 2020-02-08 MED ORDER — PANTOPRAZOLE SODIUM 40 MG PO TBEC: 40.0000 mg | DELAYED_RELEASE_TABLET | Freq: Two times a day (BID) | ORAL | 1 refills | Status: DC

## 2020-02-08 MED ORDER — DICYCLOMINE HCL 20 MG PO TABS: 20.0000 mg | ORAL_TABLET | Freq: Four times a day (QID) | ORAL | 1 refills | Status: DC | PRN

## 2020-02-08 MED ORDER — BUTALBITAL-APAP-CAFFEINE 50-325-40 MG PO TABS: 1.0000 | ORAL_TABLET | Freq: Two times a day (BID) | ORAL | 0 refills | Status: DC | PRN

## 2020-02-08 NOTE — Progress Notes (Signed)
Subjective:    Patient ID: Jennifer [name], female    DOB: [birth year], 86 y.o.   MRN: [id number]  HPI Kelty Szafran is an 86 year old female with a history of GERD, remote Barrett's esophagus, Schatzki's ring, hiatal hernia, colonic diverticulosis, internal hemorrhoids, remote colon polyps, alternating diarrhea and constipation who is here for follow-up. She is here today with her son. She was last here in January 2020 to discuss abdominal pain. CT scan at that time suggested abnormal gallbladder and she went for a robotic cholecystectomy later that month.   She reports that she is having trouble with indigestion symptoms but also dysphagia. It is hard for her to swallow solid food and she has had to "mush up" her food to help with swallowing. She can eat very soft solids. She has a persistent lump in her esophagus feeling even when she is not swallowing. She ran out of pantoprazole several months ago which she had previously been taking twice daily though she ration this and took it once daily until it ran out. She has some nausea but no vomiting. She has lost between 5 and 10 pounds. She feels like it takes her food quite a while to digest. Her bowel movements vary from no bowel movement to very small bowel movements to "spells" of urgent loose stools which are difficult to control. It seems that she will have days of constipation before having emptying episodes with diarrhea. No blood in stool or melena. She has used some Benefiber but not daily.  Of note she had an upper endoscopy performed by Dr. Shana Chute in [facility] on 06/24/2017. This showed stricture in the distal esophagus which will was opened somewhat by the scope and further with jumbo forcep biopsies in 4 quadrants. There was erosive gastritis a hiatal hernia and duodenitis. Biopsies were negative for H. pylori. GE junction showed inflamed squamous mucosa there was no Barrett's.   Review of Systems As per HPI, otherwise negative  Current  Medications, Allergies, Past Medical History, Past Surgical History, Family History and Social History were reviewed in [employer] record.     Objective:   Physical Exam BP (!) 148/66   Pulse 64   Ht 5\' 5"  (1.651 m)   Wt 142 lb (64.4 kg)   BMI 23.63 kg/m  Gen: awake, alert, NAD HEENT: anicteric CV: RRR, no mrg Pulm: CTA b/l Abd: soft, NT/ND, +BS throughout Ext: no c/c/e Neuro: nonfocal  CLINICAL DATA:  Worsening generalized abdominal pain and nausea and vomiting for 3 months.   EXAM: CT ABDOMEN AND PELVIS WITH CONTRAST   TECHNIQUE: Multidetector CT imaging of the abdomen and pelvis was performed using the standard protocol following bolus administration of intravenous contrast.   CONTRAST:  154mL OMNIPAQUE IOHEXOL 300 MG/ML  SOLN   COMPARISON:  None.   FINDINGS: Lower Chest: No acute findings.   Hepatobiliary: No hepatic masses identified. A few tiny sub-cm cysts are seen in the left lobe.   A sub-cm gallstone is seen in the gallbladder neck. The gallbladder is mildly distended and shows mild diffuse wall thickening, although there is no evidence of pericholecystic inflammatory changes or fluid. No evidence of biliary ductal dilatation.   Pancreas:  No mass or inflammatory changes.   Spleen: Within normal limits in size. A few tiny splenic cysts are noted.   Adrenals/Urinary Tract: No masses identified. No evidence of hydronephrosis.   Stomach/Bowel: Diverticulosis is seen mainly involving the sigmoid colon, however there is no evidence of diverticulitis.  Vascular/Lymphatic: No pathologically enlarged lymph nodes. No abdominal aortic aneurysm. Aortic atherosclerosis.   Reproductive:  No mass or other significant abnormality.   Other:  None.   Musculoskeletal: No suspicious bone lesions identified. Lumbar degenerative spondylosis and levoscoliosis noted.   IMPRESSION: 1. Cholelithiasis and mild diffuse gallbladder wall  thickening, suspicious for cholecystitis. Consider nuclear medicine hepatobiliary scan for further evaluation if clinically warranted. 2. No evidence of biliary ductal dilatation. 3. Colonic diverticulosis, without radiographic evidence of diverticulitis.     Electronically Signed   By: Earle Gell M.D.   On: 04/30/2018 17:18       Assessment & Plan:  86 year old female with a history of GERD, remote Barrett's esophagus, Schatzki's ring, hiatal hernia, colonic diverticulosis, internal hemorrhoids, remote colon polyps, alternating diarrhea and constipation who is here for follow-  1. GERD/hiatal hernia/Schatzki's ring --symptoms are consistent with uncontrolled acid reflux disease, possibly esophagitis but also symptomatic Schatzki's ring. We discussed this at length today. I have recommended that we resume pantoprazole 40 mg twice daily and in 3 to 4 weeks an upper endoscopy with dilation. I explained that she may need several upper endoscopies back to back if we are not able to dilate the esophagus to a reasonable size in 1 session. --Pantoprazole 40 mg twice daily AC --Upper endoscopy in 3 to 4 weeks; procedure delayed slightly to allow healing for any esophagitis currently present --Soft diet until upper endoscopy  2. IBS with alternating bowel function --it is likely that she has constipation predominant and having days of overflow diarrhea in cyclical fashion. We discussed this at length today. I recommended the following --Daily Benefiber --Addition of MiraLAX 17 g daily --Okay to use the dicyclomine 10 to 20 mg every 8 hours as needed  3. Need for primary care --she does not have a primary care provider at present and we are referring her to [facility] primary care at [facility]. She asked that I refill her Fioricet and diazepam which are chronic medications for her until she could be seen by primary care. I will give her 1 month refill on both.

## 2020-02-08 NOTE — Patient Instructions (Addendum)
You have been scheduled for an endoscopy. Please follow written instructions given to you at your visit today. If you use inhalers (even only as needed), please bring them with you on the day of your procedure.  We have sent the following medications to your pharmacy for you to pick up at your convenience: Pantoprazole 40 mg twice daily Bentyl  Dr Hilarie Fredrickson has sent a 30 day supply of Fioricet and valium to your pharmacy. This is a courtesy prescription. These are NOT refillable by our office. Further refills will need to come from your PCP.  You have been scheduled to see Wilfred Lacy, NP on 02/09/20 at 1:00 pm.  Address: [address] [place], [place] [address] Phone: [phone]  If you are age 42 or older, your body mass index should be between 23-30. Your Body mass index is 23.63 kg/m. If this is out of the aforementioned range listed, please consider follow up with your Primary Care Provider.  Due to recent changes in healthcare laws, you may see the results of your imaging and laboratory studies on MyChart before your provider has had a chance to review them.  We understand that in some cases there may be results that are confusing or concerning to you. Not all laboratory results come back in the same time frame and the provider may be waiting for multiple results in order to interpret others.  Please give [place] 48 hours in order for your provider to thoroughly review all the results before contacting the office for clarification of your results.

## 2020-02-09 ENCOUNTER — Encounter: Payer: Medicare Other | Admitting: Nurse Practitioner

## 2020-02-09 ENCOUNTER — Telehealth: Payer: Self-pay | Admitting: Nurse Practitioner

## 2020-02-09 ENCOUNTER — Other Ambulatory Visit: Payer: Self-pay

## 2020-02-09 NOTE — Telephone Encounter (Signed)
error 

## 2020-02-10 NOTE — Progress Notes (Signed)
Ms. Rion and son want to establish care with an MD This encounter was created in error - please disregard.

## 2020-03-14 ENCOUNTER — Encounter: Payer: Self-pay | Admitting: *Deleted

## 2020-03-19 ENCOUNTER — Telehealth: Payer: Self-pay | Admitting: Internal Medicine

## 2020-03-19 NOTE — Telephone Encounter (Signed)
Good morning!!  Patient called cancelled EGD for 11/30 10:00am  Dr. Hilarie Fredrickson..  Thank you Have a great week!!!

## 2020-03-19 NOTE — Telephone Encounter (Signed)
Did she give a reason for canceling her EGD? Thanks [employer]

## 2020-03-20 ENCOUNTER — Encounter: Payer: Medicare Other | Admitting: Internal Medicine

## 2020-04-04 ENCOUNTER — Other Ambulatory Visit: Payer: Self-pay

## 2020-04-05 ENCOUNTER — Ambulatory Visit (INDEPENDENT_AMBULATORY_CARE_PROVIDER_SITE_OTHER): Payer: Medicare Other | Admitting: Family Medicine

## 2020-04-05 ENCOUNTER — Encounter: Payer: Self-pay | Admitting: Family Medicine

## 2020-04-05 VITALS — BP 140/76 | HR 78 | Temp 98.3°F | Ht 63.75 in | Wt 144.6 lb

## 2020-04-05 DIAGNOSIS — Z7689 Persons encountering health services in other specified circumstances: Secondary | ICD-10-CM

## 2020-04-05 DIAGNOSIS — E785 Hyperlipidemia, unspecified: Secondary | ICD-10-CM

## 2020-04-05 DIAGNOSIS — G43B0 Ophthalmoplegic migraine, not intractable: Secondary | ICD-10-CM

## 2020-04-05 DIAGNOSIS — Z2821 Immunization not carried out because of patient refusal: Secondary | ICD-10-CM | POA: Diagnosis not present

## 2020-04-05 DIAGNOSIS — J4520 Mild intermittent asthma, uncomplicated: Secondary | ICD-10-CM

## 2020-04-05 DIAGNOSIS — F411 Generalized anxiety disorder: Secondary | ICD-10-CM

## 2020-04-05 MED ORDER — DIAZEPAM 5 MG PO TABS: 5.0000 mg | ORAL_TABLET | Freq: Two times a day (BID) | ORAL | 2 refills | Status: DC | PRN

## 2020-04-05 MED ORDER — ALBUTEROL SULFATE HFA 108 (90 BASE) MCG/ACT IN AERS: 2.0000 | INHALATION_SPRAY | Freq: Four times a day (QID) | RESPIRATORY_TRACT | 2 refills | Status: DC | PRN

## 2020-04-05 MED ORDER — BUTALBITAL-APAP-CAFFEINE 50-325-40 MG PO TABS: 1.0000 | ORAL_TABLET | Freq: Two times a day (BID) | ORAL | 2 refills | Status: DC | PRN

## 2020-04-05 NOTE — Progress Notes (Signed)
Jennifer [name] is a 86 y.o. female  Chief Complaint  Patient presents with  . Establish Care    NP- establish care. Discuss cholesterol and Asthma.  Declines flu shot and covid.      HPI: Jennifer [name] is a 86 y.o. female here to establish care as a new patient with our office. She has a PMHx significant for lumbar DDD, OA, HLD, asthma, anxiety, IBS. Last PCP w/ [facility].   She follows with GI Dr. Hilarie Fredrickson and needs to reschedule EGD with dilation.  She has HLD. There is not FLP on file for pt. She is not on any medication for HLD. She had 2 pieces of toast and rice at 11am. Last FLP appears to have been in 07/2017.   She has h/o asthma and has PRN albuterol inhaler which she needs a refill of. She estimates she uses it most days of the week once per day. She has never been on a daily maintenance inhaler. She denies hospitalization or ER visit for her asthma.   For her anxiety she takes valium 5mg  1-2 x/day at most and has done so for at least a few years.  She states she is upset because she is nearly blind which is very upsetting/stressful. She states she cannot be as active as she once was.   She has a long h/o migraine headaches and these have gotten worse recently d/t her eye issues. She takes Fioricet 1-2x/day PRN.   She declines flu and covid vaccines.    Past Medical History:  Diagnosis Date  . Anxiety   . Asthma   . Cholecystitis   . Chronic migraine without aura   . Degenerative disc disease at L5-S1 level   . Depression   . Glaucoma   . Hiatal hernia   . Hyperlipidemia   . Irritable bowel   . Osteoarthritis     Past Surgical History:  Procedure Laterality Date  . CHOLECYSTECTOMY    . EYE SURGERY    . TONSILECTOMY, ADENOIDECTOMY, BILATERAL MYRINGOTOMY AND TUBES      Social History   Socioeconomic History  . Marital status: Widowed    Spouse name: Not on file  . Number of children: 1  . Years of education: Not on file  . Highest education level: Not  on file  Occupational History  . Not on file  Tobacco Use  . Smoking status: Never Smoker  . Smokeless tobacco: Never Used  Vaping Use  . Vaping Use: Never used  Substance and Sexual Activity  . Alcohol use: Never  . Drug use: Never  . Sexual activity: Not Currently  Other Topics Concern  . Not on file  Social History Narrative  . Not on file   Social Determinants of Health   Financial Resource Strain: Not on file  Food Insecurity: Not on file  Transportation Needs: Not on file  Physical Activity: Not on file  Stress: Not on file  Social Connections: Not on file  Intimate Partner Violence: Not on file    Family History  Problem Relation Age of Onset  . Diabetes Mother   . Stroke Mother   . Heart disease Father   . Diabetes Sister   . Diabetes Brother      Immunization History  Administered Date(s) Administered  . Pneumococcal Conjugate-13 07/05/2015  . Pneumococcal Polysaccharide-23 07/24/2017    Outpatient Encounter Medications as of 04/05/2020  Medication Sig Note  . acetaminophen (TYLENOL) 500 MG tablet Take 500 mg by  mouth 2 (two) times daily as needed for moderate pain or headache.    . albuterol (PROVENTIL HFA;VENTOLIN HFA) 108 (90 Base) MCG/ACT inhaler Inhale 2 puffs into the lungs every 6 (six) hours as needed for wheezing or shortness of breath.    . butalbital-acetaminophen-caffeine (FIORICET) 50-325-40 MG tablet Take 1 tablet by mouth 2 (two) times daily as needed for headache.   . diazepam (VALIUM) 5 MG tablet Take 1 tablet (5 mg total) by mouth at bedtime as needed for anxiety.   . dicyclomine (BENTYL) 20 MG tablet Take 1 tablet (20 mg total) by mouth 4 (four) times daily as needed for spasms.   Marland Kitchen latanoprost (XALATAN) 0.005 % ophthalmic solution Place 1 drop into both eyes at bedtime.    . meclizine (ANTIVERT) 25 MG tablet Take 25 mg by mouth 3 (three) times daily as needed for dizziness. 04/05/2020: Hasn't had an RX for 1 year  . pantoprazole  (PROTONIX) 40 MG tablet Take 1 tablet (40 mg total) by mouth 2 (two) times daily.    No facility-administered encounter medications on file as of 04/05/2020.     ROS: Pertinent positives and negatives noted in HPI. Remainder of ROS non-contributory    Allergies  Allergen Reactions  . Erythromycin Rash and Other (See Comments)    Blisters    BP 140/76   Pulse 78   Temp 98.3 F (36.8 C) (Temporal)   Ht 5' 3.75" (1.619 m)   Wt 144 lb 9.6 oz (65.6 kg)   SpO2 98%   BMI 25.02 kg/m    BP Readings from Last 3 Encounters:  04/05/20 140/76  02/09/20 130/72  02/08/20 (!) 148/66   Pulse Readings from Last 3 Encounters:  04/05/20 78  02/09/20 70  02/08/20 64   Wt Readings from Last 3 Encounters:  04/05/20 144 lb 9.6 oz (65.6 kg)  02/09/20 142 lb 12.8 oz (64.8 kg)  02/08/20 142 lb (64.4 kg)    Physical Exam Constitutional:      General: She is not in acute distress.    Appearance: Normal appearance. She is not ill-appearing.  Cardiovascular:     Rate and Rhythm: Normal rate and regular rhythm.     Pulses: Normal pulses.  Pulmonary:     Effort: No respiratory distress.  Musculoskeletal:     Right lower leg: No edema.     Left lower leg: No edema.  Neurological:     Mental Status: She is alert and oriented to person, place, and time.  Psychiatric:        Mood and Affect: Mood normal.        Behavior: Behavior normal.      A/P:  1. Encounter to establish care with new doctor  2. Influenza vaccination declined by patient  3. Mild intermittent asthma without complication - no recent exacerbations - pt is using daily most days but denies having been prescribed or recommended daily maintanence inhaler. She declines that today Refill: - albuterol (VENTOLIN HFA) 108 (90 Base) MCG/ACT inhaler; Inhale 2 puffs into the lungs every 6 (six) hours as needed for wheezing or shortness of breath.  Dispense: 2 each; Refill: 2  4. Hyperlipidemia, unspecified hyperlipidemia  type - not on any medication and has not been for years  - Lipid panel; Future - Comprehensive metabolic panel; Future - CBC; Future  5. Anxiety, generalized - chronic but worse in last few years - on valium PRN x years w/ good efficacy and no side effects - database  reviewed and appropriate Refill: - diazepam (VALIUM) 5 MG tablet; Take 1 tablet (5 mg total) by mouth 2 (two) times daily as needed for anxiety.  Dispense: 60 tablet; Refill: 2  6. 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  Dispense: 60 tablet; Refill: 2    This visit occurred during the SARS-CoV-2 public health emergency.  Safety protocols were in place, including screening questions prior to the visit, additional usage of staff PPE, and extensive cleaning of exam room while observing appropriate contact time as indicated for disinfecting solutions.

## 2020-04-05 NOTE — Patient Instructions (Signed)
-   butalbital-acetaminophen-caffeine (FIORICET) 50-325-40 MG tablet; Take 1 tablet by mouth 2 (two) times daily as needed for headache.  Dispense: 60 tablet; Refill: 2  - diazepam (VALIUM) 5 MG tablet; Take 1 tablet (5 mg total) by mouth 2 (two) times daily as needed for anxiety.  Dispense: 60 tablet; Refill: 2  - albuterol (VENTOLIN HFA) 108 (90 Base) MCG/ACT inhaler; Inhale 2 puffs into the lungs every 6 (six) hours as needed for wheezing or shortness of breath.  Dispense: 2 each; Refill: 2

## 2020-04-30 ENCOUNTER — Ambulatory Visit: Payer: Medicare Other | Admitting: Medical

## 2020-09-21 ENCOUNTER — Telehealth: Payer: Self-pay | Admitting: Internal Medicine

## 2020-09-21 NOTE — Telephone Encounter (Signed)
Patient having abd pain/sore throat// wants to know what to do to treat the pain in abd..   Plz advise  thanks

## 2020-09-21 NOTE — Telephone Encounter (Signed)
Tried to reach pt and the line rings then goes to a fast busy. Will try later

## 2020-09-27 ENCOUNTER — Other Ambulatory Visit: Payer: Self-pay

## 2020-09-27 DIAGNOSIS — R1319 Other dysphagia: Secondary | ICD-10-CM

## 2020-09-27 NOTE — Telephone Encounter (Signed)
Spoke with pt and she states she was scheduled for an EGD and had to cancel it due to not being able to walk, pt reports it was due to cancer on her leg. Pt rescheduled the EGD to 11/13/20 at 9:30am. Pt aware of appt and will call back if she cannot keep the appt due to transportation.

## 2020-10-15 ENCOUNTER — Other Ambulatory Visit: Payer: Self-pay | Admitting: Family Medicine

## 2020-10-15 DIAGNOSIS — F411 Generalized anxiety disorder: Secondary | ICD-10-CM

## 2020-10-15 DIAGNOSIS — G43B0 Ophthalmoplegic migraine, not intractable: Secondary | ICD-10-CM

## 2020-10-15 NOTE — Telephone Encounter (Signed)
Last OV 04/05/20 Last fill 04/05/20  #60/2

## 2020-10-15 NOTE — Telephone Encounter (Signed)
Caller Name: Frederika, self Call back phone #: [phone]  MEDICATION(S): diazepam (VALIUM) 5 MG tablet - only a few days left butalbital-acetaminophen-caffeine (FIORICET) 50-325-40 MG tablet - only a few days left  Has the patient contacted their pharmacy? No. - new pharmacy  Preferred Pharmacy: [facility] [address] [place], [place]. AT [address]

## 2020-10-18 MED ORDER — DIAZEPAM 5 MG PO TABS: 5.0000 mg | ORAL_TABLET | Freq: Two times a day (BID) | ORAL | 0 refills | Status: DC | PRN

## 2020-10-18 MED ORDER — BUTALBITAL-APAP-CAFFEINE 50-325-40 MG PO TABS: 1.0000 | ORAL_TABLET | Freq: Two times a day (BID) | ORAL | 0 refills | Status: DC | PRN

## 2020-10-18 NOTE — Telephone Encounter (Signed)
This refill request just came to my inbasket this AM. Meds refilled but pt needs appt (likely w new provider) prior to next refill since both meds are controlled substances

## 2020-10-18 NOTE — Telephone Encounter (Signed)
Pt called this moring in regards to when her refills would be at pharmacy.  I informed pt that Dr. Loletha Grayer is seeing pts this morning and would get to it as soon as she can.

## 2020-11-13 ENCOUNTER — Encounter: Payer: Medicare Other | Admitting: Internal Medicine

## 2020-11-28 ENCOUNTER — Encounter: Payer: Self-pay | Admitting: Internal Medicine

## 2020-11-28 ENCOUNTER — Encounter: Payer: Medicare Other | Admitting: Internal Medicine

## 2020-11-28 ENCOUNTER — Ambulatory Visit (AMBULATORY_SURGERY_CENTER): Payer: Medicare Other | Admitting: Internal Medicine

## 2020-11-28 ENCOUNTER — Other Ambulatory Visit: Payer: Self-pay

## 2020-11-28 VITALS — BP 110/62 | HR 84 | Temp 97.6°F | Resp 18 | Ht 63.75 in | Wt 144.0 lb

## 2020-11-28 DIAGNOSIS — R1319 Other dysphagia: Secondary | ICD-10-CM

## 2020-11-28 DIAGNOSIS — K219 Gastro-esophageal reflux disease without esophagitis: Secondary | ICD-10-CM | POA: Diagnosis not present

## 2020-11-28 DIAGNOSIS — K2970 Gastritis, unspecified, without bleeding: Secondary | ICD-10-CM

## 2020-11-28 DIAGNOSIS — K222 Esophageal obstruction: Secondary | ICD-10-CM

## 2020-11-28 DIAGNOSIS — K319 Disease of stomach and duodenum, unspecified: Secondary | ICD-10-CM | POA: Diagnosis not present

## 2020-11-28 DIAGNOSIS — K2900 Acute gastritis without bleeding: Secondary | ICD-10-CM

## 2020-11-28 DIAGNOSIS — R1010 Upper abdominal pain, unspecified: Secondary | ICD-10-CM

## 2020-11-28 MED ORDER — SODIUM CHLORIDE 0.9 % IV SOLN: 500.0000 mL | Freq: Once | INTRAVENOUS | Status: DC

## 2020-11-28 MED ORDER — PANTOPRAZOLE SODIUM 40 MG PO TBEC: 40.0000 mg | DELAYED_RELEASE_TABLET | Freq: Two times a day (BID) | ORAL | 3 refills | Status: DC

## 2020-11-28 NOTE — Progress Notes (Signed)
Report to PACU, RN, vss, BBS= Clear.  

## 2020-11-28 NOTE — Op Note (Signed)
[facility] Patient Name: Jennifer [name] Procedure Date: 11/28/2020 10:05 AM MRN: [id number] Endoscopist: Jerene Bears , MD Age: 87 Referring MD:  Date of Birth: December 26, 1933 Gender: Female Account #: [id number] Procedure:                Upper GI endoscopy Indications:              Upper abdominal pain (s/p lap chole in Jan 2020 for                            gallstones), Dysphagia, Gastro-esophageal reflux                            disease Medicines:                Monitored Anesthesia Care Procedure:                Pre-Anesthesia Assessment:                           - Prior to the procedure, a History and Physical                            was performed, and patient medications and                            allergies were reviewed. The patient's tolerance of                            previous anesthesia was also reviewed. The risks                            and benefits of the procedure and the sedation                            options and risks were discussed with the patient.                            All questions were answered, and informed consent                            was obtained. Prior Anticoagulants: The patient has                            taken no previous anticoagulant or antiplatelet                            agents. ASA Grade Assessment: III - A patient with                            severe systemic disease. After reviewing the risks                            and benefits, the patient was deemed in  satisfactory condition to undergo the procedure.                           After obtaining informed consent, the endoscope was                            passed under direct vision. Throughout the                            procedure, the patient's blood pressure, pulse, and                            oxygen saturations were monitored continuously. The                            GIF [id number] was introduced through  the                            mouth, and advanced to the second part of duodenum.                            The upper GI endoscopy was accomplished without                            difficulty. The patient tolerated the procedure                            well. Scope In: Scope Out: Findings:                 A low-grade of narrowing Schatzki ring was found at                            the gastroesophageal junction. A TTS dilator was                            passed through the scope. Dilation with a                            13.5-14.5-15.5 mm balloon dilator was performed to                            15.5 mm. The dilation site was examined and showed                            mild mucosal disruption and moderate improvement in                            luminal narrowing.                           A 3 cm hiatal hernia was present.                           The gastroesophageal flap valve was  visualized                            endoscopically and classified as Hill Grade IV (no                            fold, wide open lumen, hiatal hernia present).                           Mild inflammation characterized by erosions and                            erythema was found in the gastric antrum. Biopsies                            were taken with a cold forceps for histology and                            Helicobacter pylori testing.                           The examined duodenum was normal. Complications:            No immediate complications. Estimated Blood Loss:     Estimated blood loss was minimal. Impression:               - Low-grade of narrowing Schatzki ring. Dilated.                           - 3 cm hiatal hernia.                           - Gastritis. Biopsied.                           - Normal examined duodenum. Recommendation:           - Patient has a contact number available for                            emergencies. The signs and symptoms of potential                             delayed complications were discussed with the                            patient. Return to normal activities tomorrow.                            Written discharge instructions were provided to the                            patient.                           - Resume previous diet.                           -  Continue present medications.                           - Await pathology results.                           - Would try MiraLax 17 g daily to help with more                            consistent bowel movements and improve constipation.                           - Office follow-up with me next available. Jerene Bears, MD 11/28/2020 10:24:24 AM This report has been signed electronically.

## 2020-11-28 NOTE — Progress Notes (Signed)
[facility] History and Physical   Primary Care Physician:  Ronnald Nian, DO   Reason for Procedure:  Upper endoscopy with possible dilation  Plan:    Upper endoscopy in the [facility] with monitored anesthesia care     HPI: Jennifer [name] is a 87 y.o. female with history of GERD, hiatal hernia, remote Barrett's esophagus, Schatzki's ring and other medical issues listed below who presents for outpatient upper endoscopy.  She was seen in the office in October 2021 to discuss uncontrolled acid reflux as well as dysphagia symptoms.  At that time she was placed on pantoprazole 40 mg twice daily.  She reports the pantoprazole has been quite helpful for her GERD symptoms.  She is back down to once per day.  She does not endorse dysphagia today.  She does about 30 minutes after she eats have stomach pain and headache.  She feels constipation for which she uses Benefiber sometimes and prunes sometimes.  She reports it feels like food simply stops somewhere after she eats it though she does not feel like this is esophageal related.  No blood in stool or melena.  Patient is appropriate for outpatient upper endoscopy today in the [facility].  Past Medical History:  Diagnosis Date   Anxiety    Asthma    Cancer ([place])    Cataract    Cholecystitis    Chronic migraine without aura    Degenerative disc disease at L5-S1 level    Depression    GERD (gastroesophageal reflux disease)    Glaucoma    Hiatal hernia    Hyperlipidemia    Irritable bowel    Neuromuscular disorder (H[facility])    Osteoarthritis     Past Surgical History:  Procedure Laterality Date   CHOLECYSTECTOMY     EYE SURGERY     TONSILECTOMY, ADENOIDECTOMY, BILATERAL MYRINGOTOMY AND TUBES      Prior to Admission medications   Medication Sig Start Date End Date Taking? Authorizing Provider  acetaminophen (TYLENOL) 500 MG tablet Take 500 mg by mouth 2 (two) times daily as needed for moderate pain or headache.    Yes [provider]  albuterol (VENTOLIN HFA) 108 (90 Base) MCG/ACT inhaler Inhale 2 puffs into the lungs every 6 (six) hours as needed for wheezing or shortness of breath. 04/05/20  Yes Cirigliano, Garvin Fila, DO  butalbital-acetaminophen-caffeine (FIORICET) [phone] MG tablet Take 1 tablet by mouth 2 (two) times daily as needed for headache. 10/18/20 10/18/21 Yes Cirigliano, Garvin Fila, DO  diazepam (VALIUM) 5 MG tablet Take 1 tablet (5 mg total) by mouth 2 (two) times daily as needed for anxiety. 10/18/20  Yes Cirigliano, Mary K, DO  dicyclomine (BENTYL) 20 MG tablet Take 1 tablet (20 mg total) by mouth 4 (four) times daily as needed for spasms. 02/08/20  Yes Marvens Hollars, Lajuan Lines, MD  latanoprost (XALATAN) 0.005 % ophthalmic solution Place 1 drop into both eyes at bedtime.    Yes [provider]  pantoprazole (PROTONIX) 40 MG tablet Take 1 tablet (40 mg total) by mouth 2 (two) times daily. 02/08/20  Yes Heaven Meeker, Lajuan Lines, MD  meclizine (ANTIVERT) 25 MG tablet Take 25 mg by mouth 3 (three) times daily as needed for dizziness. Patient not taking: Reported on 11/28/2020    [provider]    Current Outpatient Medications  Medication Sig Dispense Refill   acetaminophen (TYLENOL) 500 MG tablet Take 500 mg by mouth 2 (two) times daily as needed for moderate pain or headache.  albuterol (VENTOLIN HFA) 108 (90 Base) MCG/ACT inhaler Inhale 2 puffs into the lungs every 6 (six) hours as needed for wheezing or shortness of breath. 2 each 2   butalbital-acetaminophen-caffeine (FIORICET) 50-325-40 MG tablet Take 1 tablet by mouth 2 (two) times daily as needed for headache. 60 tablet 0   diazepam (VALIUM) 5 MG tablet Take 1 tablet (5 mg total) by mouth 2 (two) times daily as needed for anxiety. 60 tablet 0   dicyclomine (BENTYL) 20 MG tablet Take 1 tablet (20 mg total) by mouth 4 (four) times daily as needed for spasms. 360 tablet 1   latanoprost (XALATAN) 0.005 % ophthalmic solution Place 1 drop into both eyes at  bedtime.      pantoprazole (PROTONIX) 40 MG tablet Take 1 tablet (40 mg total) by mouth 2 (two) times daily. 180 tablet 1   meclizine (ANTIVERT) 25 MG tablet Take 25 mg by mouth 3 (three) times daily as needed for dizziness. (Patient not taking: Reported on 11/28/2020)     Current Facility-Administered Medications  Medication Dose Route Frequency Provider Last Rate Last Admin   0.9 %  sodium chloride infusion  500 mL Intravenous Once Rozetta Stumpp, Lajuan Lines, MD        Allergies as of 11/28/2020 - Review Complete 11/28/2020  Allergen Reaction Noted   Erythromycin Rash and Other (See Comments) 02/23/2014    Family History  Problem Relation Age of Onset   Diabetes Mother    Stroke Mother    Heart disease Father    Diabetes Sister    Diabetes Brother    Colon cancer Neg Hx    Esophageal cancer Neg Hx    Rectal cancer Neg Hx    Stomach cancer Neg Hx     Social History   Socioeconomic History   Marital status: Widowed    Spouse name: Not on file   Number of children: 1   Years of education: Not on file   Highest education level: Not on file  Occupational History   Not on file  Tobacco Use   Smoking status: Never   Smokeless tobacco: Never  Vaping Use   Vaping Use: Never used  Substance and Sexual Activity   Alcohol use: Never   Drug use: Never   Sexual activity: Not Currently  Other Topics Concern   Not on file  Social History Narrative   Not on file   Social Determinants of Health   Financial Resource Strain: Not on file  Food Insecurity: Not on file  Transportation Needs: Not on file  Physical Activity: Not on file  Stress: Not on file  Social Connections: Not on file  Intimate Partner Violence: Not on file    Review of Systems: Positive for none All other review of systems negative except as mentioned in the HPI.  Physical Exam: Vital signs BP (!) 163/90   Pulse 77   Temp 97.6 F (36.4 C)   Ht 5' 3.75" (1.619 m)   Wt 144 lb (65.3 kg)   SpO2 97%   BMI 24.91  kg/m   General:   Alert,  Well-developed, well-nourished, pleasant and cooperative in NAD Lungs:  Clear throughout to auscultation.   Heart:  Regular rate and rhythm; soft sem, clicks, rubs,  or gallops. Abdomen:  Soft, nontender and nondistended. Normal bowel sounds.   Neuro/Psych:  Alert and cooperative. Normal mood and affect. A and O x 3

## 2020-11-28 NOTE — Progress Notes (Signed)
Vitals-CW  History reviewed. 

## 2020-11-28 NOTE — Progress Notes (Signed)
Called to room to assist during endoscopic procedure.  Patient ID and intended procedure confirmed with present staff. Received instructions for my participation in the procedure from the performing physician.  

## 2020-11-28 NOTE — Patient Instructions (Signed)
YOU HAD AN ENDOSCOPIC PROCEDURE TODAY AT [facility]:   Refer to the procedure report that was given to you for any specific questions about what was found during the examination.  If the procedure report does not answer your questions, please call your gastroenterologist to clarify.  If you requested that your care partner not be given the details of your procedure findings, then the procedure report has been included in a sealed envelope for you to review at your convenience later.  YOU SHOULD EXPECT: Some feelings of bloating in the abdomen. Passage of more gas than usual.  Walking can help get rid of the air that was put into your GI tract during the procedure and reduce the bloating. If you had a lower endoscopy (such as a colonoscopy or flexible sigmoidoscopy) you may notice spotting of blood in your stool or on the toilet paper. If you underwent a bowel prep for your procedure, you may not have a normal bowel movement for a few days.  Please Note:  You might notice some irritation and congestion in your nose or some drainage.  This is from the oxygen used during your procedure.  There is no need for concern and it should clear up in a day or so.  SYMPTOMS TO REPORT IMMEDIATELY:   Following upper endoscopy (EGD)  Vomiting of blood or coffee ground material  New chest pain or pain under the shoulder blades  Painful or persistently difficult swallowing  New shortness of breath  Fever of 100F or higher  Black, tarry-looking stools  For urgent or emergent issues, a gastroenterologist can be reached at any hour by calling [phone]. Do not use MyChart messaging for urgent concerns.    DIET:  We do recommend a small meal at first, but then you may proceed to your regular diet.  Drink plenty of fluids but you should avoid alcoholic beverages for 24 hours.  MEDICATIONS: Continue present medications. Try Mirilax 17 grams by mouth daily to help with more consistent bowel  movements and improve constipation.  FOLLOW UP with Dr. Raquel James in his office at next available appointment. His office nurse will call to schedule this appointment.  Please see handouts given to you by your recovery nurse.  Thank you for allowing [place] to provide for your healthcare needs today.  ACTIVITY:  You should plan to take it easy for the rest of today and you should NOT DRIVE or use heavy machinery until tomorrow (because of the sedation medicines used during the test).    FOLLOW UP: Our staff will call the number listed on your records 48-72 hours following your procedure to check on you and address any questions or concerns that you may have regarding the information given to you following your procedure. If we do not reach you, we will leave a message.  We will attempt to reach you two times.  During this call, we will ask if you have developed any symptoms of COVID 19. If you develop any symptoms (ie: fever, flu-like symptoms, shortness of breath, cough etc.) before then, please call [phone].  If you test positive for Covid 19 in the 2 weeks post procedure, please call and report this information to [place].    If any biopsies were taken you will be contacted by phone or by letter within the next 1-3 weeks.  Please call us at [phone] if you have not heard about the biopsies in 3 weeks.    SIGNATURES/CONFIDENTIALITY: You and/or  your care partner have signed paperwork which will be entered into your electronic medical record.  These signatures attest to the fact that that the information above on your After Visit Summary has been reviewed and is understood.  Full responsibility of the confidentiality of this discharge information lies with you and/or your care-partner.

## 2020-11-30 ENCOUNTER — Telehealth: Payer: Self-pay

## 2020-11-30 ENCOUNTER — Telehealth: Payer: Self-pay | Admitting: *Deleted

## 2020-11-30 NOTE — Telephone Encounter (Signed)
  Follow up Call-  Call back number 11/28/2020  Post procedure Call Back phone  # [phone]  Permission to leave phone message Yes  Some recent data might be hidden     Patient questions:  Do you have a fever, pain , or abdominal swelling? No. Pain Score  0 *  Have you tolerated food without any problems? Yes.    Have you been able to return to your normal activities? Yes.    Do you have any questions about your discharge instructions: Diet   No. Medications  No. Follow up visit  No.  Do you have questions or concerns about your Care? No.  Actions: * If pain score is 4 or above: No action needed, pain <4.

## 2020-11-30 NOTE — Telephone Encounter (Signed)
  Follow up Call-  Call back number 11/28/2020  Post procedure Call Back phone  # [phone]  Permission to leave phone message Yes  Some recent data might be hidden     Patient questions:  Message left to call us if necessary.

## 2020-12-03 ENCOUNTER — Encounter: Payer: Self-pay | Admitting: Internal Medicine

## 2020-12-28 ENCOUNTER — Ambulatory Visit (INDEPENDENT_AMBULATORY_CARE_PROVIDER_SITE_OTHER): Payer: Medicare Other

## 2020-12-28 DIAGNOSIS — Z0000 Encounter for general adult medical examination without abnormal findings: Secondary | ICD-10-CM

## 2020-12-28 DIAGNOSIS — Z23 Encounter for immunization: Secondary | ICD-10-CM

## 2020-12-28 NOTE — Progress Notes (Addendum)
Subjective:   Jennifer [name] is a 87 y.o. female who presents for Medicare Annual (Subsequent) preventive examination.  I connected with  Jennifer [name] on 12/28/20 by a audio enabled telemedicine application and verified that I am speaking with the correct person using two identifiers.  Full name and DOB.    Location of patient:  home Location of provider:office Persons participating in visit: Jennifer [name] and Jennifer [name], CMA    I discussed the limitations of evaluation and management by telemedicine. The patient expressed understanding and agreed to proceed.   Review of Systems    Deferred to PCP Cardiac Risk Factors include: none     Objective:    Today's Vitals   12/28/20 0956  PainSc: 10-Worst pain ever   There is no height or weight on file to calculate BMI.  Advanced Directives 12/28/2020 05/10/2018  Does Patient Have a Medical Advance Directive? No No  Would patient like information on creating a medical advance directive? No - Patient declined No - Patient declined    Current Medications (verified) Outpatient Encounter Medications as of 12/28/2020  Medication Sig   acetaminophen (TYLENOL) 500 MG tablet Take 500 mg by mouth 2 (two) times daily as needed for moderate pain or headache.    albuterol (VENTOLIN HFA) 108 (90 Base) MCG/ACT inhaler Inhale 2 puffs into the lungs every 6 (six) hours as needed for wheezing or shortness of breath.   butalbital-acetaminophen-caffeine (FIORICET) 50-325-40 MG tablet Take 1 tablet by mouth 2 (two) times daily as needed for headache.   diazepam (VALIUM) 5 MG tablet Take 1 tablet (5 mg total) by mouth 2 (two) times daily as needed for anxiety.   dicyclomine (BENTYL) 20 MG tablet Take 1 tablet (20 mg total) by mouth 4 (four) times daily as needed for spasms.   latanoprost (XALATAN) 0.005 % ophthalmic solution Place 1 drop into both eyes at bedtime.    naproxen sodium (ALEVE) 220 MG tablet Take 220 mg by mouth daily as needed.    pantoprazole (PROTONIX) 40 MG tablet Take 1 tablet (40 mg total) by mouth 2 (two) times daily.   meclizine (ANTIVERT) 25 MG tablet Take 25 mg by mouth 3 (three) times daily as needed for dizziness. (Patient not taking: No sig reported)   Facility-Administered Encounter Medications as of 12/28/2020  Medication   0.9 %  sodium chloride infusion    Allergies (verified) Erythromycin   History: Past Medical History:  Diagnosis Date   Anxiety    Asthma    Cancer ([place])    Cataract    Cholecystitis    Chronic migraine without aura    Degenerative disc disease at L5-S1 level    Depression    GERD (gastroesophageal reflux disease)    Glaucoma    Hiatal hernia    Hyperlipidemia    Irritable bowel    Neuromuscular disorder (HCC)    Osteoarthritis    Past Surgical History:  Procedure Laterality Date   CHOLECYSTECTOMY     EYE SURGERY     LEG SKIN LESION  BIOPSY / EXCISION     TONSILECTOMY, ADENOIDECTOMY, BILATERAL MYRINGOTOMY AND TUBES     Family History  Problem Relation Age of Onset   Diabetes Mother    Stroke Mother    Heart disease Father    Diabetes Sister    Diabetes Brother    Colon cancer Neg Hx    Esophageal cancer Neg Hx    Rectal cancer Neg Hx    Stomach cancer Neg  Hx    Social History   Socioeconomic History   Marital status: Widowed    Spouse name: Not on file   Number of children: 1   Years of education: Not on file   Highest education level: Not on file  Occupational History   Not on file  Tobacco Use   Smoking status: Never   Smokeless tobacco: Never  Vaping Use   Vaping Use: Never used  Substance and Sexual Activity   Alcohol use: Never   Drug use: Never   Sexual activity: Not Currently  Other Topics Concern   Not on file  Social History Narrative   Not on file   Social Determinants of Health   Financial Resource Strain: Not on file  Food Insecurity: Not on file  Transportation Needs: Not on file  Physical Activity: Not on file  Stress:  Not on file  Social Connections: Not on file    Tobacco Counseling Counseling given: Not Answered   Clinical Intake:  Pre-visit preparation completed: Yes  Pain : 0-10 (HA's & stomach) Pain Score: 10-Worst pain ever Pain Type: Chronic pain Pain Location: Head Pain Orientation: Other (Comment) (back of head and around eyes) Pain Descriptors / Indicators: Dull, Pressure Pain Onset: Today (almost daily HA's) Pain Frequency: Intermittent Pain Relieving Factors: Aleve, Fioricet Effect of Pain on Daily Activities: limits daily acitives  Pain Relieving Factors: Aleve, Fioricet  Diabetes: No  How often do you need to have someone help you when you read instructions, pamphlets, or other written materials from your doctor or pharmacy?: 2 - Rarely  Diabetic no  Interpreter Needed?: No      Activities of Daily Living In your present state of health, do you have any difficulty performing the following activities: 12/28/2020  Hearing? N  Vision? Y  Difficulty concentrating or making decisions? N  Walking or climbing stairs? Y  Comment vision issues  Dressing or bathing? N  Doing errands, shopping? Y  Comment vision issues have made it to where she can't drive.  Preparing Food and eating ? N  Using the Toilet? N  In the past six months, have you accidently leaked urine? Y  Comment wears depends  Do you have problems with loss of bowel control? N  Managing your Medications? N  Managing your Finances? N  Housekeeping or managing your Housekeeping? N  Some recent data might be hidden    No care team member to display  Indicate any recent Medical Services you may have received from other than [facility] providers in the past year (date may be approximate).     Assessment:   This is a routine wellness examination for Jennifer [name].  Hearing/Vision screen No results found.  Dietary issues and exercise activities discussed: Current Exercise Habits: Home exercise routine (walks inside  home), Type of exercise: walking, Frequency (Times/Week): 7, Intensity: Mild, Exercise limited by: Other - see comments (vision)   Goals Addressed   None   Depression Screen PHQ 2/9 Scores 12/28/2020 04/05/2020  PHQ - 2 Score 2 0  PHQ- 9 Score 8 -    Fall Risk Fall Risk  12/28/2020 04/05/2020  Falls in the past year? 0 -  Number falls in past yr: 0 0  Injury with Fall? 0 0  Risk for fall due to : No Fall Risks -  Follow up Falls evaluation completed -    FALL RISK PREVENTION PERTAINING TO THE HOME:  Any stairs in or around the home? No  If so, are  there any without handrails? No  Home free of loose throw rugs in walkways, pet beds, electrical cords, etc? Yes  Adequate lighting in your home to reduce risk of falls? Yes   ASSISTIVE DEVICES UTILIZED TO PREVENT FALLS:  Life alert? No  Use of a cane, walker or w/c? Yes  Grab bars in the bathroom? No  Shower chair or bench in shower? No  Elevated toilet seat or a handicapped toilet? No   TIMED UP AND GO:  Was the test performed? N/A Length of time to ambulate 10 feet:N/A     Cognitive Function:     6CIT Screen 12/28/2020  What Year? 0 points  What month? 0 points  What time? 0 points  Count back from 20 0 points  Months in reverse 0 points  Repeat phrase 10 points  Total Score 10    Immunizations Immunization History  Administered Date(s) Administered   Pneumococcal Conjugate-13 07/05/2015   Pneumococcal Polysaccharide-23 07/24/2017    TDAP status: Due, Education has been provided regarding the importance of this vaccine. Advised may receive this vaccine at local pharmacy or Health Dept. Aware to provide a copy of the vaccination record if obtained from local pharmacy or Health Dept. Verbalized acceptance and understanding.  Flu Vaccine status: Declined, Education has been provided regarding the importance of this vaccine but patient still declined. Advised may receive this vaccine at local pharmacy or Health Dept.  Aware to provide a copy of the vaccination record if obtained from local pharmacy or Health Dept. Verbalized acceptance and understanding.  Pneumococcal vaccine status: Up to date  Covid-19 vaccine status: Declined, Education has been provided regarding the importance of this vaccine but patient still declined. Advised may receive this vaccine at local pharmacy or Health Dept.or vaccine clinic. Aware to provide a copy of the vaccination record if obtained from local pharmacy or Health Dept. Verbalized acceptance and understanding.  Qualifies for Shingles Vaccine? No   Zostavax completed No     Screening Tests Health Maintenance  Topic Date Due   COVID-19 Vaccine (1) Never done   URINE MICROALBUMIN  Never done   TETANUS/TDAP  Never done   Zoster Vaccines- Shingrix (1 of 2) Never done   DEXA SCAN  Never done   INFLUENZA VACCINE  11/19/2020   PNA vac Low Risk Adult  Completed   HPV VACCINES  Aged Out    Health Maintenance  Health Maintenance Due  Topic Date Due   COVID-19 Vaccine (1) Never done   URINE MICROALBUMIN  Never done   TETANUS/TDAP  Never done   Zoster Vaccines- Shingrix (1 of 2) Never done   DEXA SCAN  Never done   INFLUENZA VACCINE  11/19/2020    Colorectal cancer screening: No longer required. Aged out  Mammogram status: No longer required due to aged out.    Lung Cancer Screening: (Low Dose CT Chest recommended if Age 60-80 years, 30 pack-year currently smoking OR have quit w/in 15years.) does not qualify.   Lung Cancer Screening Referral: N/A  Additional Screening:  Hepatitis C Screening: does not qualify; Completed N/A  Vision Screening: Recommended annual ophthalmology exams for early detection of glaucoma and other disorders of the eye. Is the patient up to date with their annual eye exam?  Yes  Who is the provider or what is the name of the office in which the patient attends annual eye exams? Dr Franchot Mimes  If pt is not established with a provider,  would they like to be  referred to a provider to establish care? No .   Dental Screening: Recommended annual dental exams for proper oral hygiene  Community Resource Referral / Chronic Care Management: CRR required this visit?  No   CCM required this visit?  No      Plan:     I have personally reviewed and noted the following in the patient's chart:   Medical and social history Use of alcohol, tobacco or illicit drugs  Current medications and supplements including opioid prescriptions.  Functional ability and status Nutritional status Physical activity Advanced directives List of other physicians Hospitalizations, surgeries, and ER visits in previous 12 months Vitals Screenings to include cognitive, depression, and falls Referrals and appointments  In addition, I have reviewed and discussed with patient certain preventive protocols, quality metrics, and best practice recommendations. A written personalized care plan for preventive services as well as general preventive health recommendations were provided to patient.     Konrad Saha, [place]   12/28/2020   Nurse Notes: none face to face 55 mins encounter

## 2021-01-03 ENCOUNTER — Other Ambulatory Visit: Payer: Self-pay | Admitting: Family Medicine

## 2021-01-03 DIAGNOSIS — J4520 Mild intermittent asthma, uncomplicated: Secondary | ICD-10-CM

## 2021-01-03 DIAGNOSIS — F411 Generalized anxiety disorder: Secondary | ICD-10-CM

## 2021-01-03 DIAGNOSIS — G43B0 Ophthalmoplegic migraine, not intractable: Secondary | ICD-10-CM

## 2021-01-03 MED ORDER — BUTALBITAL-APAP-CAFFEINE 50-325-40 MG PO TABS: 1.0000 | ORAL_TABLET | Freq: Two times a day (BID) | ORAL | 0 refills | Status: DC | PRN

## 2021-01-03 MED ORDER — ALBUTEROL SULFATE HFA 108 (90 BASE) MCG/ACT IN AERS: 2.0000 | INHALATION_SPRAY | Freq: Four times a day (QID) | RESPIRATORY_TRACT | 2 refills | Status: DC | PRN

## 2021-01-03 MED ORDER — DIAZEPAM 5 MG PO TABS: 5.0000 mg | ORAL_TABLET | Freq: Two times a day (BID) | ORAL | 0 refills | Status: DC | PRN

## 2021-01-03 NOTE — Telephone Encounter (Signed)
She also requested a call back from [place] when its done or if there are questions at [phone]

## 2021-01-03 NOTE — Telephone Encounter (Signed)
Spoke to patient and she has only a few left of the Fioricet and Diazepam.  She would like to get qty # 2 of the Albuterol sent so she can have one at home and one in her purse.  Can she get #90 on the pills due to difficulty in getting to the pharmacy.  Please review and advise.  Thanks. Dm/cma

## 2021-01-03 NOTE — Telephone Encounter (Signed)
Pt called and said that she had talked to [facility] and that she was told to call when she needs a medication refill, please advise. albuterol (VENTOLIN HFA) 108 (90 Base) MCG/ACT inhaler diazepam (VALIUM) 5 MG tablet butalbital-acetaminophen-caffeine (FIORICET) 50-325-40 MG tablet  She wants to know if she can get a 90d/s on the pills and if she can get 2 of the abuterol She said it needs to go to the [facility] pharmacy right here at [employer]

## 2021-01-04 NOTE — Telephone Encounter (Signed)
Patient notified VIA phone. Dm/cma  

## 2021-02-08 ENCOUNTER — Telehealth: Payer: Self-pay | Admitting: Family Medicine

## 2021-02-08 NOTE — Telephone Encounter (Signed)
Refill request for:  Diazepam 5 mg LR 01/03/21, #60, 0 rf  Fioricet 50-325-40 mg LR 04/05/20 FOV 03/29/21  Please review and advise.  Thanks. Dm/cma

## 2021-02-08 NOTE — Telephone Encounter (Signed)
What is the name of the medication? diazepam (VALIUM) 5 MG tablet [[id number]] and butalbital-acetaminophen-caffeine (FIORICET) 50-325-40 MG tablet [[id number]]   Have you contacted your pharmacy to request a refill? Yes, she is needing a refill. TOC with Dr. Gena Fray in 12/22.  Which pharmacy would you like this sent to? [facility] Address: [address], [place], [place] [address]   Patient notified that their request is being sent to the clinical staff for review and that they should receive a call once it is complete. If they do not receive a call within 72 hours they can check with their pharmacy or our office.

## 2021-02-11 NOTE — Telephone Encounter (Signed)
I called patient to advise she would need an appt for med refills. She is scheduled for a TOC appt with Dr Gena Fray on 03/29/21. She does not drive and her son from [place] bring her. Stated she has no other way to get to an appt.

## 2021-02-11 NOTE — Telephone Encounter (Signed)
Lft VM to rtn call. Dm/cma  

## 2021-02-12 NOTE — Telephone Encounter (Signed)
Spoke to patient in length (18 mins) regarding needing an appointment for med refills.  She states that she can't come in and won't bother her sone who lives in [place] to bring her in.  She wants me to cancel the [facility] with Dr Gena Fray on 03/29/21.  Will wait till closer to do that to make sure it wasn't a decision that she will change in a short time. Will set thsi to come back to me in December to check with her again.   Dm/cma

## 2021-02-26 NOTE — Telephone Encounter (Signed)
Noted. Dm/cma  

## 2021-02-26 NOTE — Telephone Encounter (Signed)
Received typed letter from pt about being dissatisfied. I called and spoke with her and she said that LB has been too much trouble and she is going to find a provider closer to her home. She has a friend that can take her to appointments but our office is further away and inconvenient for her. Her son lives 2 hrs away and cannot bring her.   Pt stated she is out of her headache medications and using extra strength tylenol. I offered med mgmt visit 11/9 with Wilfred Lacy, NP. She declined and again stated she would be finding a new provider. Pt confirmed wanting to cancel 12/9 [facility] appt with Dr. Gena Fray.   Pt was provided my name and advised to call me if I can be of assistance while she is looking for a new PCP office. She is aware appt is needed, at least virtually, for additional medications.

## 2021-03-29 ENCOUNTER — Encounter: Payer: Medicare Other | Admitting: Family Medicine

## 2021-07-30 ENCOUNTER — Ambulatory Visit (INDEPENDENT_AMBULATORY_CARE_PROVIDER_SITE_OTHER): Payer: Medicare Other | Admitting: Family Medicine

## 2021-07-30 ENCOUNTER — Ambulatory Visit (HOSPITAL_BASED_OUTPATIENT_CLINIC_OR_DEPARTMENT_OTHER)
Admission: RE | Admit: 2021-07-30 | Discharge: 2021-07-30 | Disposition: A | Discharge status: Home / Self Care | Payer: Medicare Other | Source: Ambulatory Visit | Attending: Family Medicine | Admitting: Family Medicine

## 2021-07-30 ENCOUNTER — Encounter: Payer: Self-pay | Admitting: Family Medicine

## 2021-07-30 VITALS — BP 141/54 | HR 80 | Ht 63.75 in | Wt 135.0 lb

## 2021-07-30 DIAGNOSIS — L309 Dermatitis, unspecified: Secondary | ICD-10-CM

## 2021-07-30 DIAGNOSIS — F411 Generalized anxiety disorder: Secondary | ICD-10-CM

## 2021-07-30 DIAGNOSIS — M545 Low back pain, unspecified: Secondary | ICD-10-CM

## 2021-07-30 DIAGNOSIS — G8929 Other chronic pain: Secondary | ICD-10-CM | POA: Diagnosis present

## 2021-07-30 DIAGNOSIS — M546 Pain in thoracic spine: Secondary | ICD-10-CM

## 2021-07-30 DIAGNOSIS — G43B0 Ophthalmoplegic migraine, not intractable: Secondary | ICD-10-CM | POA: Diagnosis not present

## 2021-07-30 MED ORDER — DIAZEPAM 5 MG PO TABS: 5.0000 mg | ORAL_TABLET | Freq: Two times a day (BID) | ORAL | 0 refills | Status: DC | PRN

## 2021-07-30 MED ORDER — TRIAMCINOLONE ACETONIDE 0.1 % EX CREA
1.0000 | TOPICAL_CREAM | Freq: Two times a day (BID) | CUTANEOUS | 0 refills | Status: DC
Start: 2021-07-30 — End: 2022-06-30

## 2021-07-30 MED ORDER — BUTALBITAL-APAP-CAFFEINE 50-325-40 MG PO TABS: 1.0000 | ORAL_TABLET | Freq: Two times a day (BID) | ORAL | 0 refills | Status: DC | PRN

## 2021-07-30 NOTE — Progress Notes (Signed)
? ?______________________________________________________________________ ? ?HPI ?Jennifer [name] is a 88 y.o. female presenting to [facility] at [facility] today to establish care. She is here with family friend who helps her with getting to appointments, etc. She was previously at [facility] office with Dr. Bryan Lemma.  ? ?Patient Care Team: ?Terrilyn Saver, NP as PCP - General (Family Medicine) ? ?Health Maintenance  ?Topic Date Due  ? COVID-19 Vaccine (1) Never done  ? Urine Protein Check  Never done  ? Tetanus Vaccine  Never done  ? Zoster (Shingles) Vaccine (1 of 2) Never done  ? DEXA scan (bone density measurement)  Never done  ? Flu Shot  11/19/2021  ? Pneumonia Vaccine  Completed  ? HPV Vaccine  Aged Out  ? ? ? ?Concerns today: ? ?Migraines-patient reports she has struggled with migraines since she was in her 22s.  States they are ocular migraines that occur almost daily.  Reports that she has seen almost every neurologist in the area many years ago and they have tried both preventative and as needed medications.  States nothing has worked in the past, but the Fioricet seems to do pretty well although she does have to take it almost every day.  She denies any significant changes, new vision changes, change in headache presentation, chest pain, dyspnea, weakness, falls. ? ?Anxiety- Patient reports that she has been on as needed Valium for over 20 years.  States she does often have to take it twice a day as prescribed.  Reports that she has tried other things in the past that did not work well for her.  She does not have any SI/HI.  She is aware of the risk of taking this medication at her age including risk of falls, sedation, etc.  Reports that she is very safe and observant.  ? ?GERD/IBS - She follows with GI and takes Protonix and Bentyl.  She does not have any acute concerns.  States her medications are working well and she is taking fiber supplements in addition.  Reports she eats  fairly healthy most of the time, mostly vegetarian. ? ? ?Chronic back pain - She reports that she has had chronic back pain along with chronic knee pain bilaterally (history of arthritis and corticosteroid injections).  States that her back pain has been getting progressively worse over the past 2 years.  Reports about 10 years ago she had x-rays of her back which showed arthritis and scoliosis.  States that pain will be worse by the end of the day, although lying on the bed on her stomach helps to relax and stretch it out.  She likes wearing an abdominal binder/back brace to give some added support.  She would like to know if anything further can be done regarding her back pain given the progression.  She denies any new weakness, radiation, incontinence.  She would like to see an orthopedic, and she is open to physical therapy. ? ?Eczema - Patient reports long history of eczema/itchy, dry skin primarily on her legs.  States that a dermatologist in the past mixed some triamcinolone with Cetaphil which seemed to help her symptoms significantly.  She is requesting something similar.  No acute flares today. ? ? ? ?There are no problems to display for this patient. ? ? ? ? ?______________________________________________________________________ ?PMH ?Past Medical History:  ?Diagnosis Date  ? Anxiety   ? Asthma   ? Cancer [facility])   ? Cataract   ? Cholecystitis   ? Chronic migraine  without aura   ? Degenerative disc disease at L5-S1 level   ? Depression   ? GERD (gastroesophageal reflux disease)   ? Glaucoma   ? Hiatal hernia   ? Hyperlipidemia   ? Irritable bowel   ? Neuromuscular disorder ([place])   ? Osteoarthritis   ? ? ?ROS ?All review of systems negative except what is listed in the HPI ? ?PHYSICAL EXAM ?Physical Exam ?Vitals reviewed.  ?Constitutional:   ?   Appearance: Normal appearance. She is normal weight.  ?Cardiovascular:  ?   Rate and Rhythm: Normal rate and regular rhythm.  ?Pulmonary:  ?   Effort: Pulmonary  effort is normal.  ?   Breath sounds: Normal breath sounds.  ?Skin: ?   General: Skin is warm and dry.  ?   Findings: No erythema or rash.  ?Neurological:  ?   General: No focal deficit present.  ?   Mental Status: She is alert and oriented to person, place, and time. Mental status is at baseline.  ?Psychiatric:     ?   Mood and Affect: Mood normal.     ?   Behavior: Behavior normal.     ?   Thought Content: Thought content normal.     ?   Judgment: Judgment normal.  ? ?______________________________________________________________________ ?ASSESSMENT AND PLAN ? ?1. Ophthalmoplegic migraine, not intractable ?Patient requesting refill.  Discussed that this medication really should not be taken as frequently as she is using it and this could potentially be causing her rebound headaches.  Encouraged that she try to wean back as possible.  She is not interested in any preventative or other as needed medications at this time as this is the only thing that is worked in the past years.  She declines neurology referral at this time, but will let us know if anything changes in regards to her symptoms. ?- butalbital-acetaminophen-caffeine (FIORICET) 50-325-40 MG tablet; Take 1 tablet by mouth 2 (two) times daily as needed for headache.  Dispense: 60 tablet; Refill: 0 ? ?2. Anxiety, generalized ?Thoroughly discussed the risk of taking Valium at her age.  Recommend that she try to start cutting back if able and only take the medication if necessary.  However given that she has been on this dose for so long, I am hesitant to try weaning her too much at her age for risk of withdrawal.  She is very well educated on safety and fall prevention as well as medication safety. ?- diazepam (VALIUM) 5 MG tablet; Take 1 tablet (5 mg total) by mouth 2 (two) times daily as needed for anxiety.  Dispense: 60 tablet; Refill: 0 ? ?3. Chronic midline low back pain, unspecified whether sciatica present ?4. Chronic midline thoracic back pain ?We  will update x-rays today given her back pain is progressively worsening.  Entire back is painful at times although most significant pain tends to be mid to lower.  She would like to see an orthopedic to see if there are any other options, and will also ask them about her knee pain.  She is willing to see physical therapy for this as well.  Discussed safety in the home. ? ?- Ambulatory referral to Orthopedic Surgery ?- Ambulatory referral to Physical Therapy ?- DG Cervical Spine Complete; Future ?- DG Lumbar Spine Complete; Future ?- DG Thoracic Spine W/Swimmers; Future ? ?5. Eczema, unspecified type ?No acute flares today.  We will add some triamcinolone cream for as needed use for flares (recommended that she not use  more than 10 days/month to prevent skin breakdown/thinning).  Encouraged her to use regular gentle lotion such as Cetaphil, take lukewarm showers/baths, pat dry, try to avoid scratching, avoid harsh dyes/detergents/soaps. ?- triamcinolone cream (KENALOG) 0.1 %; Apply 1 application. topically 2 (two) times daily.  Dispense: 30 g; Refill: 0 ? ? ?Establish care ?Education provided today during visit and on AVS for patient to review at home.  ?Diet and Exercise recommendations provided.  ?Current diagnoses and recommendations discussed. ?HM recommendations reviewed with recommendations.  ? ? ?Outpatient Encounter Medications as of 07/30/2021  ?Medication Sig  ? dicyclomine (BENTYL) 20 MG tablet Take 1 tablet (20 mg total) by mouth 4 (four) times daily as needed for spasms.  ? latanoprost (XALATAN) 0.005 % ophthalmic solution Place 1 drop into both eyes at bedtime.   ? pantoprazole (PROTONIX) 40 MG tablet Take 1 tablet (40 mg total) by mouth 2 (two) times daily.  ? triamcinolone cream (KENALOG) 0.1 % Apply 1 application. topically 2 (two) times daily.  ? [DISCONTINUED] butalbital-acetaminophen-caffeine (FIORICET) 50-325-40 MG tablet Take 1 tablet by mouth 2 (two) times daily as needed for headache.  ?  [DISCONTINUED] diazepam (VALIUM) 5 MG tablet Take 1 tablet (5 mg total) by mouth 2 (two) times daily as needed for anxiety.  ? acetaminophen (TYLENOL) 500 MG tablet Take 500 mg by mouth 2 (two) times daily as needed

## 2021-07-30 NOTE — Patient Instructions (Signed)
Do your best to cut back on the migraine medicine, because you could be causing rebound headaches. Let me know if you are willing to see a neurologist or if you have any changes.  ?Valium is not recommended in the elderly. Do your best to start cutting tabs in half and not using unless absolutely necessary.  ? ?Thank you for choosing [facility] for your Primary Care needs. I am excited for the opportunity to partner with you to meet your health care goals. It was a pleasure meeting you today! ? ? ?Information on diet, exercise, and health maintenance recommendations are listed below. This is information to help you be sure you are on track for optimal health and monitoring.  ? ?Please look over this and let us know if you have any questions or if you have completed any of the health maintenance outside of [place] so that we can be sure your records are up to date.  ?___________________________________________________________ ? ?MyChart:  ?For all urgent or time sensitive needs we ask that you please call the office to avoid delays. Our number is [phone]. ?MyChart is not constantly monitored and due to the large volume of messages a day, replies may take up to 72 business hours. ? ?MyChart Policy: ?MyChart allows for you to see your visit notes, after visit summary, provider recommendations, lab and tests results, make an appointment, request refills, and contact your provider or the office for non-urgent questions or concerns. Providers are seeing patients during normal business hours and do not have built in time to review MyChart messages.  ?We ask that you allow a minimum of 3 business days for responses to [employer]. For this reason, please do not send urgent requests through [place]. Please call the office at [phone]. ?New and ongoing conditions may require a visit. We have virtual and in-person visits available for your convenience.  ?Complex MyChart  concerns may require a visit. Your provider may request you schedule a virtual or in-person visit to ensure we are providing the best care possible. ?MyChart messages sent after 11:00 AM on Friday will not be received by the provider until Monday morning.  ?  ?Lab and Test Results: ?You will receive your lab and test results on MyChart as soon as they are completed and results have been sent by the lab or testing facility. Due to this service, you will receive your results BEFORE your provider.  ?I review lab and test results each morning prior to seeing patients. Some results require collaboration with other providers to ensure you are receiving the most appropriate care. For this reason, we ask that you please allow a minimum of 3-5 business days from the time that ALL results have been received for your provider to receive and review lab and test results and contact you about these.  ?Most lab and test result comments from the provider will be sent through [place]. Your provider may recommend changes to the plan of care, follow-up visits, repeat testing, ask questions, or request an office visit to discuss these results. You may reply directly to this message or call the office to provide information for the provider or set up an appointment. ?In some instances, you will be called with test results and recommendations. Please let us know if this is preferred and we will make note of this in your chart to provide this for you.    ?If you have not heard a response to your  lab or test results in 5 business days from all results returning to [place], please call the office to let us know. We ask that you please avoid calling prior to this time unless there is an emergent concern. Due to high call volumes, this can delay the resulting process. ? ?After Hours: ?For all non-emergency after hours needs, please call the office at [phone] and select the option to reach the on-call  service. On-call services are shared  between multiple [place] offices and therefore it will not be possible to speak directly with your provider. On-call providers may provide medical advice and recommendations, but are unable to provide refills for maintenance medications.  ?For all emergency or urgent medical needs after normal business hours, we recommend that you seek care at the closest Urgent Care or Emergency Department to ensure appropriate treatment in a timely manner.  ?[facility] has a 24 hour emergency room located on the ground floor for your convenience.  ? ?Urgent Concerns During the Business Day ?Providers are seeing patients from 8AM to [place] with a busy schedule and are most often not able to respond to non-urgent calls until the end of the day or the next business day. ?If you should have URGENT concerns during the day, please call and speak to the nurse or schedule a same day appointment so that we can address your concern without delay.  ? ?Thank you, again, for choosing me as your health care partner. I appreciate your trust and look forward to learning more about you.  ? ?[employer]. Jennifer Bowens, DNP, FNP-C ? ?___________________________________________________________ ? ?Health Maintenance Recommendations ?Screening Testing ?Mammogram ?Every 1-2 years based on history and risk factors ?Starting at age 9 ?Pap Smear ?Ages 21-39 every 3 years ?Ages 2-65 every 5 years with HPV testing ?More frequent testing may be required based on results and history ?Colon Cancer Screening ?Every 1-10 years based on test performed, risk factors, and history ?Starting at age 30 ?Bone Density Screening ?Every 2-10 years based on history ?Starting at age 91 for women ?Recommendations for men differ based on medication usage, history, and risk factors ?AAA Screening ?One time ultrasound ?Men 84-69 years old who have ever smoked ?Lung Cancer Screening ?Low Dose Lung CT every 12 months ?Age 76-80 years with a 20 pack-year smoking  history who still smoke or who have quit within the last 15 years ? ?Screening Labs ?Routine  Labs: Complete Blood Count (CBC), Complete Metabolic Panel (CMP), Cholesterol (Lipid Panel) ?Every 6-12 months based on history and medications ?May be recommended more frequently based on current conditions or previous results ?Hemoglobin A1c Lab ?Every 3-12 months based on history and previous results ?Starting at age 39 or earlier with diagnosis of diabetes, high cholesterol, BMI >26, and/or risk factors ?Frequent monitoring for patients with diabetes to ensure blood sugar control ?Thyroid Panel (TSH w/ T3 & T4) ?Every 6 months based on history, symptoms, and risk factors ?May be repeated more often if on medication ?HIV ?One time testing for all patients 103 and older ?May be repeated more frequently for patients with increased risk factors or exposure ?Hepatitis C ?One time testing for all patients 20 and older ?May be repeated more frequently for patients with increased risk factors or exposure ?Gonorrhea, Chlamydia ?Every 12 months for all sexually active persons 13-24 years ?Additional monitoring may be recommended for those who are considered high risk or who have symptoms ?PSA ?Men 66-34 years old with risk factors ?Additional screening may be  recommended from age 27-69 based on risk factors, symptoms, and history ? ?Vaccine Recommendations ?Tetanus Booster ?All adults every 10 years ?Flu Vaccine ?All patients 6 months and older every year ?COVID Vaccine ?All patients 12 years and older ?Initial dosing with booster ?May recommend additional booster based on age and health history ?HPV Vaccine ?2 doses all patients age 37-26 ?Dosing may be considered for patients over 26 ?Shingles Vaccine (Shingrix) ?2 doses all adults 50 years and older ?Pneumonia (Pneumovax 23) ?All adults 63 years and older ?May recommend earlier dosing based on health history ?Pneumonia (Prevnar 40) ?All adults 17 years and older ?Dosed 1 year  after Pneumovax 23 ?Pneumonia (Prevnar 3) ?All adults 48 years and older (adults 58-85 with certain conditions or risk factors) ?1 dose  ?For those who have no received Prevnar 13 vaccine previously ? ? ?Add

## 2021-08-06 ENCOUNTER — Telehealth: Payer: Self-pay | Admitting: Family Medicine

## 2021-08-06 NOTE — Telephone Encounter (Signed)
Pt was returning Amber's call back, to go over imaging. Please advise.  ?

## 2021-08-06 NOTE — Telephone Encounter (Signed)
See result note.  

## 2021-08-12 ENCOUNTER — Other Ambulatory Visit: Payer: Self-pay | Admitting: Family Medicine

## 2021-08-12 DIAGNOSIS — G8929 Other chronic pain: Secondary | ICD-10-CM

## 2021-10-02 ENCOUNTER — Other Ambulatory Visit: Payer: Self-pay

## 2021-10-02 ENCOUNTER — Other Ambulatory Visit: Payer: Self-pay | Admitting: Family Medicine

## 2021-10-02 ENCOUNTER — Telehealth: Payer: Self-pay | Admitting: Internal Medicine

## 2021-10-02 DIAGNOSIS — G43B0 Ophthalmoplegic migraine, not intractable: Secondary | ICD-10-CM

## 2021-10-02 DIAGNOSIS — F411 Generalized anxiety disorder: Secondary | ICD-10-CM

## 2021-10-02 MED ORDER — PANTOPRAZOLE SODIUM 40 MG PO TBEC: 40.0000 mg | DELAYED_RELEASE_TABLET | Freq: Two times a day (BID) | ORAL | 0 refills | Status: DC

## 2021-10-02 NOTE — Telephone Encounter (Signed)
Requesting: Fioricet 50-325-'40mg'$  and diazepam '5mg'$   Contract: None UDS: None Last Visit: 07/30/21 Next Visit: None Last Refill on Fioricet 07/30/21 #60 and 0RF Last Refill on diazepam: 07/30/21 #60 and 0RF  Please Advise

## 2021-10-02 NOTE — Telephone Encounter (Signed)
30 days supply only. Patient needs a follow up visit , left a message to call and schedule.

## 2021-10-02 NOTE — Telephone Encounter (Signed)
Patient called to request a refill on Pantoprazole to be sent to [facility] [phone] [facility]

## 2021-10-03 ENCOUNTER — Telehealth: Payer: Self-pay | Admitting: Family Medicine

## 2021-10-03 ENCOUNTER — Encounter: Payer: Self-pay | Admitting: Family Medicine

## 2021-10-03 DIAGNOSIS — G43B0 Ophthalmoplegic migraine, not intractable: Secondary | ICD-10-CM

## 2021-10-03 NOTE — Telephone Encounter (Signed)
Medication: diazepam (VALIUM) 5 MG tablet butalbital-acetaminophen-caffeine (FIORICET) 50-325-40 MG tablet  Has the patient contacted their pharmacy? No. (If no, request that the patient contact the pharmacy for the refill.) (If yes, when and what did the pharmacy advise?)  Preferred Pharmacy (with phone number or street name): [id number] - [address], [place] [address]  Phone:  [phone]  Fax:  [phone]

## 2021-10-03 NOTE — Telephone Encounter (Signed)
Refill provided for this month, but needs appointments every 3 months (due to be seen in July) for any future refills.

## 2021-10-03 NOTE — Telephone Encounter (Signed)
PCP sent in today.

## 2021-10-03 NOTE — Telephone Encounter (Signed)
Letter mailed to Pt informing due for appt in July.

## 2021-10-09 ENCOUNTER — Telehealth: Payer: Self-pay | Admitting: *Deleted

## 2021-10-09 NOTE — Telephone Encounter (Signed)
Called pt to schedule 3 month follow up with PCP after 10/29/21 and to see if pt would schedule AWV.  Pt has multiple concerns, the main one being transportation.  Pt does not drive and has very limited transportation. Has a child that lives 200 miles away and a caregiver that also assists other pt's and has currently been ill with COVID herself.  Pt states she will have to wait until she can find out from caregiver when she can bring her in for an appointment.  Pt also notes significant vision impairment and is unable to come in and out for her appts unassisted.   FYI for upcoming appt:  Pt voices frustration with prescription quantity of 30 days only. Notes that previous providers have given a year's worth of refills in the past.  I explained current prescribing laws and restrictions for controlled substances. Pt also notes that it is cheaper if she gets a 90 day supply at a time instead of 30 days as she pays out of pocket for her medications.   Pt says she has not had labs done in 5 years. Advised her I was not sure if she would be able to have labs at next visit but she could be fasting just in case.  Pt did not schedule an AWV. I don't think she fully understood what that entailed.  Maybe you could discuss with her at her upcoming appt in July.

## 2021-11-13 ENCOUNTER — Other Ambulatory Visit: Payer: Self-pay | Admitting: Family Medicine

## 2021-11-13 DIAGNOSIS — F411 Generalized anxiety disorder: Secondary | ICD-10-CM

## 2021-11-13 DIAGNOSIS — G43B0 Ophthalmoplegic migraine, not intractable: Secondary | ICD-10-CM

## 2021-11-13 NOTE — Telephone Encounter (Signed)
Requesting: diazepam '5mg'$   Contract: None UDS: None Last Visit: 07/30/21 Next Visit: None Last Refill on diazepam: 10/03/21 #60 and 0RF Last Refill on Fioricet 10/03/21 #60 and 0RF  Please Advise

## 2021-11-15 NOTE — Telephone Encounter (Signed)
Message sent to Pt informing she is overdue for visit back in June.

## 2022-01-08 ENCOUNTER — Telehealth: Payer: Self-pay | Admitting: Family Medicine

## 2022-01-08 DIAGNOSIS — G43B0 Ophthalmoplegic migraine, not intractable: Secondary | ICD-10-CM

## 2022-01-08 DIAGNOSIS — F411 Generalized anxiety disorder: Secondary | ICD-10-CM

## 2022-01-08 NOTE — Telephone Encounter (Signed)
PDMP reviewed, I am covering for PCP. Diazepam is refilled regularly. I'll send a small amount of Fioricet.

## 2022-01-08 NOTE — Telephone Encounter (Signed)
Requesting: diazepam '5mg'$  and Fioricet 50-325-'40mg'$  Contract: None UDS: None Last Visit: 07/30/21 Next Visit: None Last Refill on diazepam: 11/15/21 #60 and 0RF Last Refill on Fioricet: 11/15/21 #60 and 0RF  Please Advise

## 2022-01-23 ENCOUNTER — Ambulatory Visit (INDEPENDENT_AMBULATORY_CARE_PROVIDER_SITE_OTHER): Payer: Medicare Other | Admitting: Physician Assistant

## 2022-01-23 ENCOUNTER — Encounter: Payer: Self-pay | Admitting: Physician Assistant

## 2022-01-23 VITALS — BP 140/80 | HR 66 | Ht 63.0 in | Wt 137.0 lb

## 2022-01-23 DIAGNOSIS — R1010 Upper abdominal pain, unspecified: Secondary | ICD-10-CM

## 2022-01-23 DIAGNOSIS — K219 Gastro-esophageal reflux disease without esophagitis: Secondary | ICD-10-CM

## 2022-01-23 DIAGNOSIS — K582 Mixed irritable bowel syndrome: Secondary | ICD-10-CM

## 2022-01-23 MED ORDER — PANTOPRAZOLE SODIUM 40 MG PO TBEC: 40.0000 mg | DELAYED_RELEASE_TABLET | Freq: Every day | ORAL | 4 refills | Status: DC

## 2022-01-23 MED ORDER — DICYCLOMINE HCL 20 MG PO TABS: 20.0000 mg | ORAL_TABLET | Freq: Three times a day (TID) | ORAL | 11 refills | Status: DC | PRN

## 2022-01-23 NOTE — Progress Notes (Signed)
Subjective:    Patient ID: Jennifer [name], female    DOB: 1934-01-08, 88 y.o.   MRN: [id number]  HPI [name] is an 88 year old white female, established with Dr. Hilarie Fredrickson.  She was last seen in the office in October 2021.  She has history of GERD, remote Barrett's esophagus, Schatzki's ring, diverticulosis, remote history of colon polyps, IBS, and is status post cholecystectomy in January 2020. She underwent EGD in August 2022 for complaints of GERD and dysphagia.  She was found to have low-grade esophageal ring which was TTS dilated to 15.5, noted to have a 3 cm hiatal hernia and mild gastritis.  Biopsies showed reactive gastropathy no H. pylori.  She comes in today with multiple complaints.  She has been doing okay as far as her reflux symptoms are concerned, though she previously had been on twice daily Protonix says most of the time she just takes this once a day and feels that works because she does not necessarily eat 3 meals per day. She says every couple of weeks she will have an episode of urgency for bowel movements and has to rush to the bathroom and will usually pass stool which is the consistency of chocolate pudding and very dark.  After 1 of these episodes she usually does not feel like eating for the rest of the day though does not have further episodes of diarrhea or any nausea or vomiting.  She frequently has both headache and abdominal discomfort within about 20 to 30 minutes after eating and describes this as a "bellyache".  Between those episodes of dark stool her stools are normal-appearing. She is very concerned because last night she developed a sore throat and some soreness in her esophageal area no fever no chills, she feels hoarse today no cough and no shortness of breath.  She says she stayed up most of the night because she was worried about her symptoms. She describes a couple of episodes of what she thinks is aspiration says the last time happened a couple of weeks ago when  she was trying to swallow liquid and then felt like it got into her airway and caused her to have a lot of coughing.  She says she gets anxious living by herself and is worried she is going to choke on something.  She is not complaining of any solid food dysphagia.  Previously been given dicyclomine to use for her episodes of abdominal pain but says last time she took that she had burning in her esophagus so has not used it anymore that just occurred on 1 occasion.  She is not taking Benefiber at this point because it did not help but uses MiraLAX as needed.   Review of Systems.Pertinent positive and negative review of systems were noted in the above HPI section.  All other review of systems was otherwise negative.   Outpatient Encounter Medications as of 01/23/2022  Medication Sig   acetaminophen (TYLENOL) 500 MG tablet Take 500 mg by mouth 2 (two) times daily as needed for moderate pain or headache.    butalbital-acetaminophen-caffeine (FIORICET) 50-325-40 MG tablet TAKE 1 TABLET BY MOUTH TWICE A DAY AS NEEDED FOR HEADACHE NEED APPOINTMENT FOR FUTURE REFILLS   diazepam (VALIUM) 5 MG tablet TAKE ONE TABLET BY MOUTH TWICE A DAY AS NEEDED FOR ANXIETY NEED APPOINTMENT FOR FUTURE REFILLS   latanoprost (XALATAN) 0.005 % ophthalmic solution Place 1 drop into both eyes at bedtime.    [DISCONTINUED] pantoprazole (PROTONIX) 40 MG tablet Take 1 tablet (  40 mg total) by mouth 2 (two) times daily.   dicyclomine (BENTYL) 20 MG tablet Take 1 tablet (20 mg total) by mouth 3 (three) times daily as needed for spasms.   pantoprazole (PROTONIX) 40 MG tablet Take 1 tablet (40 mg total) by mouth daily.   triamcinolone cream (KENALOG) 0.1 % Apply 1 application. topically 2 (two) times daily. (Patient not taking: Reported on 01/23/2022)   [DISCONTINUED] dicyclomine (BENTYL) 20 MG tablet Take 1 tablet (20 mg total) by mouth 4 (four) times daily as needed for spasms. (Patient not taking: Reported on 01/23/2022)    Facility-Administered Encounter Medications as of 01/23/2022  Medication   0.9 %  sodium chloride infusion   Allergies  Allergen Reactions   Erythromycin Rash and Other (See Comments)    Blisters   There are no problems to display for this patient.  Social History   Socioeconomic History   Marital status: Widowed    Spouse name: Not on file   Number of children: 1   Years of education: Not on file   Highest education level: Not on file  Occupational History   Not on file  Tobacco Use   Smoking status: Never   Smokeless tobacco: Never  Vaping Use   Vaping Use: Never used  Substance and Sexual Activity   Alcohol use: Never   Drug use: Never   Sexual activity: Not Currently  Other Topics Concern   Not on file  Social History Narrative   Not on file   Social Determinants of Health   Financial Resource Strain: Not on file  Food Insecurity: Not on file  Transportation Needs: Not on file  Physical Activity: Not on file  Stress: Not on file  Social Connections: Not on file  Intimate Partner Violence: Not on file    Ms. Stmarie's family history includes Diabetes in her brother, mother, and sister; Heart disease in her father; Stroke in her mother.      Objective:    Vitals:   01/23/22 1042  BP: (!) 140/80  Pulse: 66    Physical Exam Well-developed elderly WF femalein no acute distress.  Height, Weight,137 BMI 24.2  HEENT; nontraumatic normocephalic, EOMI, PE R LA, sclera anicteric. Oropharynx; not examined today Neck; supple, no JVD Cardiovascular; regular rate and rhythm with S1-S2, no murmur rub or gallop Pulmonary; Clear bilaterally Abdomen; soft, no focal tenderness nondistended, no palpable mass or hepatosplenomegaly, bowel sounds are active Rectal; brown stool heme-negative Skin; benign exam, no jaundice rash or appreciable lesions Extremities; no clubbing cyanosis or edema skin warm and dry Neuro/Psych; alert and oriented x4, grossly nonfocal  mood and affect appropriate , anxious       Assessment & Plan:   #3 88 year old white female with history of chronic GERD, recent dilation August 2022 for low-grade Schatzki's ring. No current dysphagia symptoms Currently stable on once daily Protonix 40 mg  #2 episodic urgency for bowel movements followed by loose dark stool this occurs every few weeks and she says she does not feel well after those episodes and generally does not eat.  Also complains of episodes of abdominal pain that occur after eating as does a headache. Patient unaware of any specific triggers Stool heme-negative today Most consistent with IBS type symptoms  #3 status postcholecystectomy #4 sore throat/hoarseness, chest burning onset last evening-probable viral syndrome  Plan; continue Protonix 40 mg p.o. every morning AC breakfast She is willing to retry dicyclomine, advised to take with applesauce or yogurt, and use on  an as-needed basis for abdominal pain Continue MiraLAX 17 g in 8 ounces water daily as needed Warm salt water gargles 4 times daily for sore throat and hoarseness, if symptoms progress and/or she develops other associated signs and symptoms i.e. fever chills etc. she was advised to contact her PCP for further management  Thomson Herbers Genia Harold PA-C 01/23/2022   Cc: Terrilyn Saver, NP

## 2022-01-23 NOTE — Patient Instructions (Addendum)
If you are age 86 or younger, your body mass index should be between 19-25. Your Body mass index is 24.27 kg/m. If this is out of the aformentioned range listed, please consider follow up with your Primary Care Provider.   __________________________________________________________  The [facility] providers would like to encourage you to use [facility] to communicate with providers for non-urgent requests or questions.  Due to long hold times on the telephone, sending your provider a message by [facility] may be a faster and more efficient way to get a response.  Please allow 48 business hours for a response.  Please remember that this is for non-urgent requests.   Gargle with warm salt water 4 times for next few dose for sore throat. Call your PCP if symptoms doesn't improve.  Take Protonix 40 MG daily every morning with meal.  Try Dicyclomine 20 MG every 6 to 8 hours as needed for abdominal pain.  Take Gas X 2 times as needed for abdominal pain.   Thank you for choosing me and [facility].  Edward Jolly, PA-C

## 2022-01-23 NOTE — Progress Notes (Signed)
Addendum: Reviewed and agree with assessment and management plan. Rivky Clendenning M, MD  

## 2022-01-28 ENCOUNTER — Telehealth: Payer: Self-pay | Admitting: Internal Medicine

## 2022-01-28 NOTE — Telephone Encounter (Signed)
She needs to follow-up with another provider while [name] is out.

## 2022-01-28 NOTE — Telephone Encounter (Signed)
Pt was wondering if Dr.Paz could give her 60 tablets of butalbital-acetaminophen-caffeine (FIORICET) 50-325-40 MG tablet as she takes this twice daily, and that will only last her 2 weeks, and pcp will not be back in office at that time for an appt.     [id number] - [address], [place] [address] Phone: [phone]  Fax: [phone]

## 2022-01-29 NOTE — Telephone Encounter (Signed)
Called pt to schedule an appt. She stated she did not understand why she couldn't get the same amount of firoricet as she could valium. Advised pt this is per prescriber request, as they are not her primary and she is over due for a f/u.  She stated she does not have transportation and did not want a vv. Advised pt I could get a nurse on the line to explain this, if that would be more helpful, she declined. Stated this is ridiculous we have to see her more than once a year and she may be finding a new pcp.

## 2022-01-29 NOTE — Telephone Encounter (Signed)
Noted  

## 2022-04-23 ENCOUNTER — Other Ambulatory Visit (INDEPENDENT_AMBULATORY_CARE_PROVIDER_SITE_OTHER): Payer: Medicare Other

## 2022-04-23 ENCOUNTER — Ambulatory Visit (INDEPENDENT_AMBULATORY_CARE_PROVIDER_SITE_OTHER): Payer: Medicare Other | Admitting: Internal Medicine

## 2022-04-23 ENCOUNTER — Encounter: Payer: Self-pay | Admitting: Internal Medicine

## 2022-04-23 VITALS — BP 148/74 | HR 83 | Ht 63.0 in | Wt 139.5 lb

## 2022-04-23 DIAGNOSIS — R109 Unspecified abdominal pain: Secondary | ICD-10-CM | POA: Diagnosis not present

## 2022-04-23 DIAGNOSIS — R197 Diarrhea, unspecified: Secondary | ICD-10-CM | POA: Diagnosis not present

## 2022-04-23 DIAGNOSIS — K5900 Constipation, unspecified: Secondary | ICD-10-CM

## 2022-04-23 DIAGNOSIS — R1030 Lower abdominal pain, unspecified: Secondary | ICD-10-CM

## 2022-04-23 DIAGNOSIS — R519 Headache, unspecified: Secondary | ICD-10-CM

## 2022-04-23 DIAGNOSIS — K219 Gastro-esophageal reflux disease without esophagitis: Secondary | ICD-10-CM

## 2022-04-23 DIAGNOSIS — R198 Other specified symptoms and signs involving the digestive system and abdomen: Secondary | ICD-10-CM | POA: Diagnosis not present

## 2022-04-23 DIAGNOSIS — G8929 Other chronic pain: Secondary | ICD-10-CM

## 2022-04-23 LAB — COMPREHENSIVE METABOLIC PANEL
ALT: 9 U/L (ref 0–35)
AST: 20 U/L (ref 0–37)
Albumin: 4.7 g/dL (ref 3.5–5.2)
Alkaline Phosphatase: 94 U/L (ref 39–117)
BUN: 13 mg/dL (ref 6–23)
CO2: 30 mEq/L (ref 19–32)
Calcium: 9.6 mg/dL (ref 8.4–10.5)
Chloride: 103 mEq/L (ref 96–112)
Creatinine, Ser: 0.76 mg/dL (ref 0.40–1.20)
GFR: 70.86 mL/min (ref >60.00)
Glucose, Bld: 99 mg/dL (ref 70–99)
Potassium: 3.5 mEq/L (ref 3.5–5.1)
Sodium: 142 mEq/L (ref 135–145)
Total Bilirubin: 0.5 mg/dL (ref 0.2–1.2)
Total Protein: 7.3 g/dL (ref 6.0–8.3)

## 2022-04-23 LAB — CBC WITH DIFFERENTIAL/PLATELET
Basophils Absolute: 0 10*3/uL (ref 0.0–0.1)
Basophils Relative: 0.5 % (ref 0.0–3.0)
Eosinophils Absolute: 0.1 10*3/uL (ref 0.0–0.7)
Eosinophils Relative: 2.2 % (ref 0.0–5.0)
HCT: 41 % (ref 36.0–46.0)
Hemoglobin: 13.3 g/dL (ref 12.0–15.0)
Lymphocytes Relative: 12.4 % (ref 12.0–46.0)
Lymphs Abs: 0.6 10*3/uL — ABNORMAL LOW (ref 0.7–4.0)
MCHC: 32.4 g/dL (ref 30.0–36.0)
MCV: 89.2 fl (ref 78.0–100.0)
Monocytes Absolute: 0.7 10*3/uL (ref 0.1–1.0)
Monocytes Relative: 14.9 % — ABNORMAL HIGH (ref 3.0–12.0)
Neutro Abs: 3.4 10*3/uL (ref 1.4–7.7)
Neutrophils Relative %: 70 % (ref 43.0–77.0)
Platelets: 186 10*3/uL (ref 150.0–400.0)
RBC: 4.6 Mil/uL (ref 3.87–5.11)
RDW: 14.8 % (ref 11.5–15.5)
WBC: 4.9 10*3/uL (ref 4.0–10.5)

## 2022-04-23 NOTE — Progress Notes (Signed)
Subjective:    Patient ID: Jennifer [name], female    DOB: 1933/10/06, 89 y.o.   MRN: [id number]  HPI Jennifer [name] is an 89 year old female with a history of GERD, remote Barrett's esophagus, Schatzki's ring responsive to prior dilation, remote colon polyps, colonic diverticulosis, IBS, gallbladder disease with cholecystectomy in January 2020, chronic migraine headaches, glaucoma and macular degeneration who is here for follow-up.  She is here today with her son.  She was last seen in early October 2023 by Nicoletta Ba, PA-C.  She reports that she continues to have issues with irregular bowel movements.  She will rarely have diarrhea and this usually comes after several days of small incomplete bowel movements or no bowel movement at all.  She has lower abdominal painful episodes and Bentyl does not seem to help with these.  She tried Benefiber and this did not improve her bowel movements.  She has very frequent headaches and this always seems to come after eating no matter of what the food intake is.  She uses frequent Tylenol up to 8-10 500 mg tablets daily.  She is currently out of her Fioricet generic and waiting to see primary care.  She has nausea associated with her headaches but no vomiting.  She is not having dysphagia or heartburn.  She is taking pantoprazole 40 mg daily.  Several days ago she "overdosed with MiraLAX" and had looser stool.  Overall appetite is decreased but she eats very little because she is worried it would make her headaches worse.  She has not seen visible blood in her stool but does have significant vision impairment with glaucoma and macular degeneration.   Review of Systems As per HPI, otherwise negative  Current Medications, Allergies, Past Medical History, Past Surgical History, Family History and Social History were reviewed in [employer] record.    Objective:   Physical Exam BP (!) 148/74   Pulse 83   Ht '5\' 3"'$  (1.6 m)   Wt 139 lb 8  oz (63.3 kg)   BMI 24.71 kg/m  Gen: awake, alert, NAD HEENT: anicteric, op clear CV: RRR, no 2/6 sem Pulm: CTA b/l Abd: soft, NT/ND, +BS throughout Rectal: Normal external exam, formed hard stool in the rectal vault which is light brown and heme-negative, no masses, no pain Ext: no c/c/e Neuro: nonfocal      Assessment & Plan:  89 year old female with a history of GERD, remote Barrett's esophagus, Schatzki's ring responsive to prior dilation, remote colon polyps, colonic diverticulosis, IBS, gallbladder disease with cholecystectomy in January 2020, chronic migraine headaches, glaucoma and macular degeneration who is here for follow-up.   Incomplete bowel movements/overflow loose stool/abdominal pain --high suspicion for constipation and hard stool leading to abdominal discomfort and overflow diarrhea at times.  We have discussed this today.  Benefiber was not helpful and she only tried MiraLAX but it higher than 17 g dose.  Rectal exam without concerning finding. -- MiraLAX 17 g daily; I cautioned her on avoiding taking more than the recommended doses as she will likely get diarrhea -- Remain off Benefiber as this was not helpful -- CT scan abdomen pelvis -- CBC, CMP  2.  Headaches, worse postprandially --I have a hard time associating eating with her headaches unless there is some form of allergy.  I am concerned that she is using too much Tylenol and I cautioned her multiple times to not exceed 4000 mg in any 24-hour period.  She understands that her generic Fioricet has  Tylenol and this has to be counted as well.  She may be benefiting from the caffeine and this medication more than anything else.  She will follow-up with primary care for this issue. -- Caution with Tylenol, do not exceed 4000 mg in a 24-hour period -- Can try caffeine over-the-counter or with Excedrin/acetaminophen (acetaminophen plus caffeine); she gets typically 80 mg of caffeine in the 2 doses of Fioricet which is  effective for her.  Again she is cautioned on the total Tylenol dose in any 24-hour period. -- Check celiac panel and alpha gal  3.  History of GERD with Schatzki's ring and gastritis --continue pantoprazole 40 mg daily  2 to 59-monthfollow-up, sooner if needed  40 minutes total spent today including patient facing time, coordination of care, reviewing medical history/procedures/pertinent radiology studies, and documentation of the encounter.

## 2022-04-23 NOTE — Patient Instructions (Addendum)
Your provider has requested that you go to the basement level for lab work before leaving today. Press "B" on the elevator. The lab is located at the first door on the left as you exit the elevator.  Continue Pantoprazole 40 mg - once daily.   Start Miralax 17 g ( 1 capful) in at least 8 ounces of water daily.   You may continue taking Tylenol as directed. (4,000 mg maximum daily dose in 24 hrs.)  You have been scheduled for a CT scan of the abdomen and pelvis at [facility]. You are scheduled on 04/25/22 at 4:00 pm. Please arrive at 1:45 pm for your appointment and to drink contrast.    Please follow the written instructions below on the day of your exam:   1) Do not eat anything after 12:00 pm (4 hours prior to your test)     You may take any medications as prescribed with a small amount of water, if necessary. If you take any of the following medications: METFORMIN, GLUCOPHAGE, GLUCOVANCE, AVANDAMET, RIOMET, FORTAMET, [place] MET, JANUMET, GLUMETZA or METAGLIP, you MAY be asked to HOLD this medication 48 hours AFTER the exam.   The purpose of you drinking the oral contrast is to aid in the visualization of your intestinal tract. The contrast solution may cause some diarrhea. Depending on your individual set of symptoms, you may also receive an intravenous injection of x-ray contrast/dye. Plan on being at [facility] for 45 minutes or longer, depending on the type of exam you are having performed.   If you have any questions regarding your exam or if you need to reschedule, you may call [facility] at [phone] between the hours of 8:00 am and 5:00 pm, Monday-Friday.    Thank you for choosing me and [facility].  Dr.Jay Pyrtle

## 2022-04-25 ENCOUNTER — Ambulatory Visit (HOSPITAL_BASED_OUTPATIENT_CLINIC_OR_DEPARTMENT_OTHER)
Admission: RE | Admit: 2022-04-25 | Discharge: 2022-04-25 | Disposition: A | Discharge status: Home / Self Care | Payer: Medicare Other | Source: Ambulatory Visit | Attending: Internal Medicine | Admitting: Internal Medicine

## 2022-04-25 DIAGNOSIS — R1030 Lower abdominal pain, unspecified: Secondary | ICD-10-CM | POA: Diagnosis present

## 2022-04-25 DIAGNOSIS — K5900 Constipation, unspecified: Secondary | ICD-10-CM | POA: Insufficient documentation

## 2022-04-25 MED ORDER — IOHEXOL 300 MG/ML  SOLN
80.0000 mL | Freq: Once | INTRAMUSCULAR | Status: AC | PRN
  Administered 2022-04-25: 80 mL via INTRAVENOUS

## 2022-04-26 LAB — ALPHA-GAL PANEL
Allergen, Mutton, f88: <0.1 kU/L
Allergen, Pork, f26: <0.1 kU/L
Beef: <0.1 kU/L
CLASS: 0
CLASS: 0
Class: 0
GALACTOSE-ALPHA-1,3-GALACTOSE IGE*: <0.1 kU/L (ref <0.10)

## 2022-04-26 LAB — IGA: Immunoglobulin A: 96 mg/dL (ref 70–320)

## 2022-04-26 LAB — TISSUE TRANSGLUTAMINASE, IGA: (tTG) Ab, IgA: <1 U/mL

## 2022-04-26 LAB — INTERPRETATION:

## 2022-06-30 ENCOUNTER — Encounter: Payer: Self-pay | Admitting: Family Medicine

## 2022-06-30 ENCOUNTER — Ambulatory Visit (INDEPENDENT_AMBULATORY_CARE_PROVIDER_SITE_OTHER): Payer: Medicare Other | Admitting: Family Medicine

## 2022-06-30 VITALS — BP 156/82 | HR 75 | Temp 98.2°F | Ht 63.0 in | Wt 140.4 lb

## 2022-06-30 DIAGNOSIS — E119 Type 2 diabetes mellitus without complications: Secondary | ICD-10-CM

## 2022-06-30 DIAGNOSIS — I10 Essential (primary) hypertension: Secondary | ICD-10-CM | POA: Insufficient documentation

## 2022-06-30 DIAGNOSIS — F419 Anxiety disorder, unspecified: Secondary | ICD-10-CM | POA: Diagnosis not present

## 2022-06-30 DIAGNOSIS — L309 Dermatitis, unspecified: Secondary | ICD-10-CM

## 2022-06-30 DIAGNOSIS — G43709 Chronic migraine without aura, not intractable, without status migrainosus: Secondary | ICD-10-CM | POA: Diagnosis not present

## 2022-06-30 DIAGNOSIS — I872 Venous insufficiency (chronic) (peripheral): Secondary | ICD-10-CM | POA: Insufficient documentation

## 2022-06-30 LAB — BASIC METABOLIC PANEL
BUN: 12 mg/dL (ref 6–23)
CO2: 29 mEq/L (ref 19–32)
Calcium: 9.7 mg/dL (ref 8.4–10.5)
Chloride: 102 mEq/L (ref 96–112)
Creatinine, Ser: 0.7 mg/dL (ref 0.40–1.20)
GFR: 78.11 mL/min (ref >60.00)
Glucose, Bld: 97 mg/dL (ref 70–99)
Potassium: 3.7 mEq/L (ref 3.5–5.1)
Sodium: 141 mEq/L (ref 135–145)

## 2022-06-30 LAB — LIPID PANEL
Cholesterol: 174 mg/dL (ref 0–200)
HDL: 46.7 mg/dL (ref >39.00)
LDL Cholesterol: 105 mg/dL — ABNORMAL HIGH (ref 0–99)
NonHDL: 127.35
Total CHOL/HDL Ratio: 4
Triglycerides: 110 mg/dL (ref 0.0–149.0)
VLDL: 22 mg/dL (ref 0.0–40.0)

## 2022-06-30 LAB — HEMOGLOBIN A1C: Hgb A1c MFr Bld: 5.8 % (ref 4.6–6.5)

## 2022-06-30 MED ORDER — DIAZEPAM 5 MG PO TABS: 2.5000 mg | ORAL_TABLET | Freq: Every day | ORAL | 2 refills | Status: DC | PRN

## 2022-06-30 MED ORDER — CITALOPRAM HYDROBROMIDE 10 MG PO TABS: 10.0000 mg | ORAL_TABLET | Freq: Every day | ORAL | 3 refills | Status: DC

## 2022-06-30 MED ORDER — TRIAMCINOLONE ACETONIDE 0.1 % EX CREA: 1.0000 | TOPICAL_CREAM | Freq: Two times a day (BID) | CUTANEOUS | 0 refills | Status: AC

## 2022-06-30 MED ORDER — BUTALBITAL-APAP-CAFFEINE 50-325-40 MG PO TABS: 1.0000 | ORAL_TABLET | Freq: Every day | ORAL | 2 refills | Status: DC | PRN

## 2022-06-30 MED ORDER — LISINOPRIL 5 MG PO TABS: 5.0000 mg | ORAL_TABLET | Freq: Every day | ORAL | 3 refills | Status: DC

## 2022-06-30 NOTE — Assessment & Plan Note (Signed)
We discussed appropriate management of anxiety/depression. I also discussed increased risk for falls associated with use of diazepam. I will have her reduce this to 1/2 tab daily. I will try adding citalopram. I will reassess her in 4 weeks.

## 2022-06-30 NOTE — Assessment & Plan Note (Signed)
Review of prior labs does confirm a normal GTT in 2019. I will reassess Jennifer [name] A1c and glucose today to determine if she truly has diabetes or not.

## 2022-06-30 NOTE — Assessment & Plan Note (Signed)
Discussed risk related to age associated with use of butalbital. I iwll continue to prescribe Fioricet for now, but ask that she limit this to once a day.

## 2022-06-30 NOTE — Assessment & Plan Note (Signed)
Review of past records does show consistent elevated blood pressures. I recommend we try a low dose of lisinopril to see if we can get this in better control. I will also check screening labs for potential secondary causes and complications.

## 2022-06-30 NOTE — Assessment & Plan Note (Signed)
I will continue her TAC cream.

## 2022-06-30 NOTE — Progress Notes (Signed)
[facility] [address] [place] [address] Dept: [phone] Dept Fax: [phone]  New Patient Office Visit  Subjective:    Patient ID: Jennifer [name], female    DOB: 11/19/1933, 89 y.o..   MRN: [id number]  Chief Complaint  Patient presents with   [place] care.      History of Present Illness:  Patient is in today to establish care. Jennifer [name] was born in [place], [place]. She has lived in [place]. for all except 4 years, when she lived in [place] with her husband, who was int he Social research officer, government. She worked for many years for [employer]. Jennifer [name] husband developed schizophrenia and finished out his life in a [place] facility. She lives along in [place]. Her son, Octavia Bruckner, lives in [place], [place]. She denies use of tobacco, alcohol, or drugs.  Jennifer [name] has a history of daily headaches. These have been characterized as migraines. She notes that she did see a neurologist in the past. She was tried on multiple prescription medications, none of which worked. She has been managed long-term on Fioricet. She notes she takes this about 1-2 times a day. She uses Tylenol when the Fioricet is not available, but finds she has to take 6-8 tabs and these don't fully relieve her headache.  Jennifer [name] has a history of anxiety and depression. She apparently has been managed long-term on Valium. She takes 1-2 daily for times when her "nerves" get to bothering her. This helps her calm down. Although she states she takes 1-2 a day, there are times when she can spread this out and she splits her dose. She recalls she was prescribed something else for depression, but she states when she read the name of the medicine, she was afraid of taking it.  Jennifer [name] has a questionable history of diabetes according to her record. She notes that at one point, she went through a 3-hour GTT and this was normal.  Ms.  [name] son asks about her blood pressure. he notes this has been consistently elevated, but that no one has ever addressed it. He wonders how much this might contribute to her headaches.  Past Medical History: Patient Active Problem List   Diagnosis Date Noted   Nuclear sclerotic cataract of both eyes 02/18/2016   Regular astigmatism of both eyes 01/03/2016   Presbyopia of both eyes 01/03/2016   Myopia of both eyes 01/03/2016   Keratoconjunctivitis sicca of both eyes not specified as Sjogren's 01/03/2016   Controlled type 2 diabetes mellitus without complication, without long-term current use of insulin ([place]) 01/03/2016   Exudative age-related macular degeneration ([place]) 07/08/2015   Idiopathic scoliosis of thoracolumbar spine 02/16/2015   Degenerative disc disease, lumbar 02/16/2015   Decreased peripheral vision of left eye 01/04/2015   Short-segment Barrett's esophagus 03/03/2014   Sciatica 03/03/2014   Primary osteoarthritis of right knee 03/03/2014   Primary open-angle glaucoma 03/03/2014   Other headache syndrome 03/03/2014   Ischemic optic neuropathy 03/03/2014   Irritable bowel syndrome 03/03/2014   Impaired fasting glucose 03/03/2014   Gastro-esophageal reflux disease with esophagitis 03/03/2014   Functional dyspepsia 03/03/2014   Depression 03/03/2014   Chronic nausea 03/03/2014   Chronic migraine without aura 03/03/2014   Allergic rhinitis 03/03/2014   Hyperlipidemia LDL goal <130 12/23/2013   Glaucoma 12/23/2013   Asthma 12/23/2013   Anxiety 12/23/2013   Past Surgical History:  Procedure Laterality Date   CHOLECYSTECTOMY  EYE SURGERY     LEG SKIN LESION  BIOPSY / EXCISION     TONSILECTOMY, ADENOIDECTOMY, BILATERAL MYRINGOTOMY AND TUBES     Family History  Problem Relation Age of Onset   Diabetes Mother    Stroke Mother    Heart disease Father    Diabetes Sister    Diabetes Brother    Colon cancer Neg Hx    Esophageal cancer Neg Hx    Rectal cancer  Neg Hx    Stomach cancer Neg Hx    Outpatient Medications Prior to Visit  Medication Sig Dispense Refill   acetaminophen (TYLENOL) 500 MG tablet Take 500 mg by mouth 2 (two) times daily as needed for moderate pain or headache.      albuterol (VENTOLIN HFA) 108 (90 Base) MCG/ACT inhaler Inhale 2 puffs into the lungs every 6 (six) hours as needed for wheezing or shortness of breath.     dicyclomine (BENTYL) 20 MG tablet Take 1 tablet (20 mg total) by mouth 3 (three) times daily as needed for spasms. 90 tablet 11   latanoprost (XALATAN) 0.005 % ophthalmic solution Place 1 drop into both eyes at bedtime.      pantoprazole (PROTONIX) 40 MG tablet Take 1 tablet (40 mg total) by mouth daily. 90 tablet 4   triamcinolone cream (KENALOG) 0.1 % Apply 1 application. topically 2 (two) times daily. 30 g 0   butalbital-acetaminophen-caffeine (FIORICET) 50-325-40 MG tablet TAKE 1 TABLET BY MOUTH TWICE A DAY AS NEEDED FOR HEADACHE NEED APPOINTMENT FOR FUTURE REFILLS 30 tablet 0   diazepam (VALIUM) 5 MG tablet TAKE ONE TABLET BY MOUTH TWICE A DAY AS NEEDED FOR ANXIETY NEED APPOINTMENT FOR FUTURE REFILLS 60 tablet 0   Facility-Administered Medications Prior to Visit  Medication Dose Route Frequency Provider Last Rate Last Admin   0.9 %  sodium chloride infusion  500 mL Intravenous Once Pyrtle, Lajuan Lines, MD       Allergies  Allergen Reactions   Erythromycin Rash and Other (See Comments)    Blisters   Objective:   Today's Vitals   06/30/22 0956 06/30/22 1054  BP: (!) 148/82 (!) 156/82  Pulse: 75   Temp: 98.2 F (36.8 C)   TempSrc: Temporal   SpO2: 98%   Weight: 140 lb 6.4 oz (63.7 kg)   Height: '5\' 3"'$  (1.6 m)    Body mass index is 24.87 kg/m.   General: Well developed, well nourished. No acute distress. Psych: Alert and oriented. Normal mood and affect.  Health Maintenance Due  Topic Date Due   HEMOGLOBIN A1C  Never done   COVID-19 Vaccine (1) Never done   FOOT EXAM  Never done   OPHTHALMOLOGY  EXAM  Never done   DTaP/Tdap/Td (1 - Tdap) Never done   Zoster Vaccines- Shingrix (1 of 2) Never done   DEXA SCAN  Never done   INFLUENZA VACCINE  Never done   Medicare Annual Wellness (AWV)  12/28/2021     Assessment & Plan:   Problem List Items Addressed This Visit       Cardiovascular and Mediastinum   Chronic migraine without aura    Discussed risk related to age associated with use of butalbital. I iwll continue to prescribe Fioricet for now, but ask that she limit this to once a day.       Relevant Medications   lisinopril (ZESTRIL) 5 MG tablet   citalopram (CELEXA) 10 MG tablet   butalbital-acetaminophen-caffeine (FIORICET) 50-325-40 MG tablet   Essential  hypertension    Review of past records does show consistent elevated blood pressures. I recommend we try a low dose of lisinopril to see if we can get this in better control. I will also check screening labs for potential secondary causes and complications.      Relevant Medications   lisinopril (ZESTRIL) 5 MG tablet     Endocrine   Controlled type 2 diabetes mellitus without complication, without long-term current use of insulin ([place]) - Primary    Review of prior labs does confirm a normal GTT in 2019. I will reassess Ms. Pecha A1c and glucose today to determine if she truly has diabetes or not.      Relevant Medications   lisinopril (ZESTRIL) 5 MG tablet   Other Relevant Orders   Basic metabolic panel   Hemoglobin A1c   Lipid panel     Musculoskeletal and Integument   Eczema    I will continue her TAC cream.      Relevant Medications   triamcinolone cream (KENALOG) 0.1 %     Other   Anxiety    We discussed appropriate management of anxiety/depression. I also discussed increased risk for falls associated with use of diazepam. I will have her reduce this to 1/2 tab daily. I will try adding citalopram. I will reassess her in 4 weeks.      Relevant Medications   citalopram (CELEXA) 10 MG tablet    diazepam (VALIUM) 5 MG tablet    Return in about 4 weeks (around 07/28/2022) for Reassessment.   Haydee Salter, MD

## 2022-07-02 ENCOUNTER — Telehealth: Payer: Self-pay

## 2022-07-02 NOTE — Telephone Encounter (Signed)
Spoke to patient, she states that the Lisinopril is causing diarrhea  for the last 2 days.  She is unsure if she will take the Citalopram due to side effects.   Please review. Thanks. Dm/cma

## 2022-07-03 NOTE — Telephone Encounter (Signed)
Spoke to patient and she is willing to try the Citalopram but de to having diarrhea with the Lisinopril won't take this one.   Dm/cma

## 2022-07-28 ENCOUNTER — Ambulatory Visit (INDEPENDENT_AMBULATORY_CARE_PROVIDER_SITE_OTHER): Payer: Medicare Other | Admitting: Family Medicine

## 2022-07-28 ENCOUNTER — Encounter: Payer: Self-pay | Admitting: Family Medicine

## 2022-07-28 VITALS — BP 130/70 | HR 80 | Temp 98.7°F | Ht 63.0 in | Wt 138.0 lb

## 2022-07-28 DIAGNOSIS — F419 Anxiety disorder, unspecified: Secondary | ICD-10-CM

## 2022-07-28 DIAGNOSIS — G43709 Chronic migraine without aura, not intractable, without status migrainosus: Secondary | ICD-10-CM

## 2022-07-28 DIAGNOSIS — I10 Essential (primary) hypertension: Secondary | ICD-10-CM | POA: Diagnosis not present

## 2022-07-28 DIAGNOSIS — K580 Irritable bowel syndrome with diarrhea: Secondary | ICD-10-CM | POA: Diagnosis not present

## 2022-07-28 MED ORDER — DICYCLOMINE HCL 10 MG PO CAPS: 20.0000 mg | ORAL_CAPSULE | Freq: Two times a day (BID) | ORAL | 3 refills | Status: DC

## 2022-07-28 MED ORDER — LOSARTAN POTASSIUM 25 MG PO TABS: 25.0000 mg | ORAL_TABLET | Freq: Every day | ORAL | 5 refills | Status: DC

## 2022-07-28 NOTE — Progress Notes (Signed)
[facility] [address] [place] [address] Dept: [phone] Dept Fax: [phone]  Chronic Care Office Visit  Subjective:    Patient ID: Jennifer [name], female    DOB: February 24, 1934, 89 y.o..   MRN: [id number]  Chief Complaint  Patient presents with   Medical Management of Chronic Issues    4 week f/u. C/o with meds.  Don't want to take the anti-depressant due to side effects.     History of Present Illness:  Patient is in today for reassessment of chronic medical issues.  Ms. Jennifer [name] has a history of daily headaches, characterized as migraines. She has been managed long-term on Fioricet. She notes after her last visit, she received capsules rather than tablets, which she had always received int he past. She called and spoke with a pharmacy technician about the change. The pharmacy tech apparently spoke with the pharmacist and was told what she received had aspirin rather than Tylenol (Fiorinal vs. Fioricet). MS. Jennifer [name] wants to know why I changed this.   Ms. Jennifer [name] has a history of anxiety and depression. She has been managed long-term on Valium. She state she takes 1/2 a tablet 3-4 times a week. At her last visit, I tried starting her on citalopram. She read the list of "side effects" for both citalopram and diazepam and felt the citalopram would be riskier for her to take.   Ms. Jennifer [name] has a questionable history of diabetes according to her record. She notes that at one point, she went through a 3-hour GTT and this was normal.   Ms. Jennifer [name] has essential hypertension. I prescribed lisinopril at her last visit. She noted this caused her diarrhea, so she stopped it. Her blood pressures at home have been elevated still.  Ms. Jennifer [name] has a history of IBS-D.  She is managed on dicyclomine. She notes she receives the capsules, as the tablets burned her throat and had difficulty going down.  Past Medical History: Patient  Active Problem List   Diagnosis Date Noted   Essential hypertension 06/30/2022   Eczema 06/30/2022   Venous insufficiency 06/30/2022   Nuclear sclerotic cataract of both eyes 02/18/2016   Regular astigmatism of both eyes 01/03/2016   Presbyopia of both eyes 01/03/2016   Myopia of both eyes 01/03/2016   Keratoconjunctivitis sicca of both eyes not specified as Sjogren's 01/03/2016   Exudative age-related macular degeneration 07/08/2015   Idiopathic scoliosis of thoracolumbar spine 02/16/2015   Degenerative disc disease, lumbar 02/16/2015   Decreased peripheral vision of left eye 01/04/2015   Short-segment Barrett's esophagus 03/03/2014   Sciatica 03/03/2014   Primary osteoarthritis of right knee 03/03/2014   Primary open-angle glaucoma 03/03/2014   Other headache syndrome 03/03/2014   Ischemic optic neuropathy 03/03/2014   Irritable bowel syndrome 03/03/2014   Gastro-esophageal reflux disease with esophagitis 03/03/2014   Functional dyspepsia 03/03/2014   Depression 03/03/2014   Chronic nausea 03/03/2014   Chronic migraine without aura 03/03/2014   Allergic rhinitis 03/03/2014   Hyperlipidemia LDL goal <130 12/23/2013   Glaucoma 12/23/2013   Asthma 12/23/2013   Anxiety 12/23/2013   Past Surgical History:  Procedure Laterality Date   CHOLECYSTECTOMY     LEG SKIN LESION  BIOPSY / EXCISION     TONSILECTOMY, ADENOIDECTOMY, BILATERAL MYRINGOTOMY AND TUBES     TRABECULECTOMY Left    Family History  Problem Relation Age of Onset   Diabetes Mother    Stroke Mother    Alcohol abuse Father  Heart disease Father    Heart disease Sister    Diabetes Sister    Heart disease Brother    Diabetes Brother    Heart disease Brother    Diabetes Brother    Colon cancer Neg Hx    Esophageal cancer Neg Hx    Rectal cancer Neg Hx    Stomach cancer Neg Hx    Outpatient Medications Prior to Visit  Medication Sig Dispense Refill   acetaminophen (TYLENOL) 500 MG tablet Take 500 mg by  mouth 2 (two) times daily as needed for moderate pain or headache.      albuterol (VENTOLIN HFA) 108 (90 Base) MCG/ACT inhaler Inhale 2 puffs into the lungs every 6 (six) hours as needed for wheezing or shortness of breath.     butalbital-acetaminophen-caffeine (FIORICET) 50-325-40 MG tablet Take 1 tablet by mouth daily as needed for headache. 30 tablet 2   diazepam (VALIUM) 5 MG tablet Take 0.5 tablets (2.5 mg total) by mouth daily as needed for anxiety. 15 tablet 2   latanoprost (XALATAN) 0.005 % ophthalmic solution Place 1 drop into both eyes at bedtime.      pantoprazole (PROTONIX) 40 MG tablet Take 1 tablet (40 mg total) by mouth daily. 90 tablet 4   triamcinolone cream (KENALOG) 0.1 % Apply 1 Application topically 2 (two) times daily. 30 g 0   dicyclomine (BENTYL) 20 MG tablet Take 1 tablet (20 mg total) by mouth 3 (three) times daily as needed for spasms. 90 tablet 11   citalopram (CELEXA) 10 MG tablet Take 1 tablet (10 mg total) by mouth daily. (Patient not taking: Reported on 07/28/2022) 90 tablet 3   lisinopril (ZESTRIL) 5 MG tablet Take 1 tablet (5 mg total) by mouth daily. (Patient not taking: Reported on 07/28/2022) 90 tablet 3   Facility-Administered Medications Prior to Visit  Medication Dose Route Frequency Provider Last Rate Last Admin   0.9 %  sodium chloride infusion  500 mL Intravenous Once Pyrtle, Carie CaddyJay M, MD       Allergies  Allergen Reactions   Lisinopril Diarrhea   Erythromycin Rash and Other (See Comments)    Blisters   Objective:   Today's Vitals   07/28/22 1313  BP: 130/70  Pulse: 80  Temp: 98.7 F (37.1 C)  TempSrc: Temporal  SpO2: 99%  Weight: 138 lb (62.6 kg)  Height: 5\' 3"  (1.6 m)   Body mass index is 24.45 kg/m.   General: Well developed, well nourished. No acute distress. Psych: Alert and oriented. Normal mood and affect.  Health Maintenance Due  Topic Date Due   DTaP/Tdap/Td (1 - Tdap) Never done   Zoster Vaccines- Shingrix (1 of 2) Never done    DEXA SCAN  Never done   Medicare Annual Wellness (AWV)  12/28/2021     Lab Results: Lab Results  Component Value Date   HGBA1C 5.8 06/30/2022      Latest Ref Rng & Units 06/30/2022   11:06 AM 04/23/2022   11:29 AM 04/30/2018    2:30 PM  BMP  Glucose 70 - 99 mg/dL 97  99  96   BUN 6 - 23 mg/dL 12  13  10    Creatinine 0.40 - 1.20 mg/dL 1.610.70  0.960.76  0.450.70   Sodium 135 - 145 mEq/L 141  142  144   Potassium 3.5 - 5.1 mEq/L 3.7  3.5  4.1   Chloride 96 - 112 mEq/L 102  103  102   CO2 19 - 32  mEq/L 29  30  31    Calcium 8.4 - 10.5 mg/dL 9.7  9.6  9.4    Assessment & Plan:   Problem List Items Addressed This Visit       Cardiovascular and Mediastinum   Chronic migraine without aura    I printed off the order summary to show Ms. Lassetter that I prescribed Fioricet tablets for her. If the pharmacy gave her Fiorinal capsules, this would have been a medication error on their part. I recommend she address the issue with the pharmacist.      Relevant Medications   losartan (COZAAR) 25 MG tablet   Essential hypertension    Diarrhea is noted to be a possible side effect of lisinopril. I'm unsure how she would discriminate from this and the diarrhea she has as part of her IBS. I will try her on a low dose of losartan and see if she tolerates this.      Relevant Medications   losartan (COZAAR) 25 MG tablet     Digestive   Irritable bowel syndrome - Primary    I will renew her dicyclomine, prescribing the 10 mg capsules, as she reports tolerating these better.      Relevant Medications   dicyclomine (BENTYL) 10 MG capsule     Other   Anxiety    I discussed that the list of "side effects" are "possible side effects" and do not necessarily mean she would have these issues. I also pointed out that diazepam is a controlled substance, can be habit-forming or addictive, and increases risk for falls among elderly patients. For now, we will try and keep her to limited amounts, as I feel she would  find it difficult to stop her diazepam use after such a long time of being on the medication.      No evidence that Ms. Stucker has diabetes.  Return in about 3 months (around 10/27/2022) for Reassessment.   Loyola Mast, MD

## 2022-07-28 NOTE — Assessment & Plan Note (Signed)
I printed off the order summary to show Jennifer [name] that I prescribed Fioricet tablets for her. If the pharmacy gave her Fiorinal capsules, this would have been a medication error on their part. I recommend she address the issue with the pharmacist.

## 2022-07-28 NOTE — Assessment & Plan Note (Signed)
I discussed that the list of "side effects" are "possible side effects" and do not necessarily mean she would have these issues. I also pointed out that diazepam is a controlled substance, can be habit-forming or addictive, and increases risk for falls among elderly patients. For now, we will try and keep her to limited amounts, as I feel she would find it difficult to stop her diazepam use after such a long time of being on the medication.

## 2022-07-28 NOTE — Assessment & Plan Note (Signed)
I will renew her dicyclomine, prescribing the 10 mg capsules, as she reports tolerating these better.

## 2022-07-28 NOTE — Assessment & Plan Note (Signed)
Diarrhea is noted to be a possible side effect of lisinopril. I'm unsure how she would discriminate from this and the diarrhea she has as part of her IBS. I will try her on a low dose of losartan and see if she tolerates this.

## 2022-07-31 ENCOUNTER — Telehealth: Payer: Self-pay

## 2022-07-31 NOTE — Telephone Encounter (Signed)
Received  letter from patient regarding being given the wrong medication Fiorinal instead of Fioricet by the pharmacy.called [facility], spoke to [facility], was advised that they system changed the RX. Patient waited 28 days to bring this to their attention and has since been given the correct medication; they are waiting on her to bring back the Fiorinal to the pharmacy.  Pharmacist will add Asprin allergy to her list in their  system. Dm/cma

## 2022-08-11 ENCOUNTER — Ambulatory Visit (INDEPENDENT_AMBULATORY_CARE_PROVIDER_SITE_OTHER): Payer: Medicare Other

## 2022-08-11 VITALS — Ht 63.0 in | Wt 138.0 lb

## 2022-08-11 DIAGNOSIS — Z0000 Encounter for general adult medical examination without abnormal findings: Secondary | ICD-10-CM

## 2022-08-11 NOTE — Progress Notes (Signed)
I connected with  Jennifer [name] on 08/11/22 by a audio enabled telemedicine application and verified that I am speaking with the correct person using two identifiers.  Patient Location: Home  Provider Location: Office/Clinic  I discussed the limitations of evaluation and management by telemedicine. The patient expressed understanding and agreed to proceed.  Subjective:   Jennifer [name] is a 89 y.o. female who presents for Medicare Annual (Subsequent) preventive examination.  Review of Systems     Cardiac Risk Factors include: advanced age (>53men, >48 women);dyslipidemia;hypertension     Objective:    Today's Vitals   08/11/22 1527  Weight: 138 lb (62.6 kg)  Height: 5\' 3"  (1.6 m)   Body mass index is 24.45 kg/m.     08/11/2022    3:34 PM 12/28/2020   10:05 AM 05/10/2018   11:18 AM  Advanced Directives  Does Patient Have a Medical Advance Directive? No No No  Would patient like information on creating a medical advance directive?  No - Patient declined No - Patient declined    Current Medications (verified) Outpatient Encounter Medications as of 08/11/2022  Medication Sig   acetaminophen (TYLENOL) 500 MG tablet Take 500 mg by mouth 2 (two) times daily as needed for moderate pain or headache.    albuterol (VENTOLIN HFA) 108 (90 Base) MCG/ACT inhaler Inhale 2 puffs into the lungs every 6 (six) hours as needed for wheezing or shortness of breath.   butalbital-acetaminophen-caffeine (FIORICET) 50-325-40 MG tablet Take 1 tablet by mouth daily as needed for headache.   diazepam (VALIUM) 5 MG tablet Take 0.5 tablets (2.5 mg total) by mouth daily as needed for anxiety.   dicyclomine (BENTYL) 10 MG capsule Take 2 capsules (20 mg total) by mouth 2 (two) times daily before a meal.   latanoprost (XALATAN) 0.005 % ophthalmic solution Place 1 drop into both eyes at bedtime.    pantoprazole (PROTONIX) 40 MG tablet Take 1 tablet (40 mg total) by mouth daily.   triamcinolone cream (KENALOG)  0.1 % Apply 1 Application topically 2 (two) times daily.   losartan (COZAAR) 25 MG tablet Take 1 tablet (25 mg total) by mouth daily. (Patient not taking: Reported on 08/11/2022)   Facility-Administered Encounter Medications as of 08/11/2022  Medication   0.9 %  sodium chloride infusion    Allergies (verified) Lisinopril and Erythromycin   History: Past Medical History:  Diagnosis Date   Anxiety    Arthritis    Asthma    Cancer    Cataract    Cholecystitis    Chronic migraine without aura    Degenerative disc disease at L5-S1 level    Depression    GERD (gastroesophageal reflux disease)    Glaucoma    Hiatal hernia    Hyperlipidemia    Irritable bowel    Neuromuscular disorder    Osteoarthritis    Osteopenia    Scoliosis    Past Surgical History:  Procedure Laterality Date   CHOLECYSTECTOMY     LEG SKIN LESION  BIOPSY / EXCISION     TONSILECTOMY, ADENOIDECTOMY, BILATERAL MYRINGOTOMY AND TUBES     TRABECULECTOMY Left    Family History  Problem Relation Age of Onset   Diabetes Mother    Stroke Mother    Alcohol abuse Father    Heart disease Father    Heart disease Sister    Diabetes Sister    Heart disease Brother    Diabetes Brother    Heart disease Brother    Diabetes Brother  Colon cancer Neg Hx    Esophageal cancer Neg Hx    Rectal cancer Neg Hx    Stomach cancer Neg Hx    Social History   Socioeconomic History   Marital status: Widowed    Spouse name: Not on file   Number of children: 1   Years of education: Not on file   Highest education level: Not on file  Occupational History   Not on file  Tobacco Use   Smoking status: Never   Smokeless tobacco: Never  Vaping Use   Vaping Use: Never used  Substance and Sexual Activity   Alcohol use: Never   Drug use: Never   Sexual activity: Not Currently  Other Topics Concern   Not on file  Social History Narrative   Not on file   Social Determinants of Health   Financial Resource Strain: Low  Risk  (08/11/2022)   Overall Financial Resource Strain (CARDIA)    Difficulty of Paying Living Expenses: Not hard at all  Food Insecurity: No Food Insecurity (08/11/2022)   Hunger Vital Sign    Worried About Running Out of Food in the Last Year: Never true    Ran Out of Food in the Last Year: Never true  Transportation Needs: No Transportation Needs (08/11/2022)   PRAPARE - Administrator, Civil Service (Medical): No    Lack of Transportation (Non-Medical): No  Physical Activity: Inactive (08/11/2022)   Exercise Vital Sign    Days of Exercise per Week: 0 days    Minutes of Exercise per Session: 0 min  Stress: No Stress Concern Present (08/11/2022)   [employer] of Occupational Health - Occupational Stress Questionnaire    Feeling of Stress : Not at all  Social Connections: Not on file    Tobacco Counseling Counseling given: Not Answered   Clinical Intake:  Pre-visit preparation completed: Yes  Pain : No/denies pain     Nutritional Status: BMI of 19-24  Normal Nutritional Risks: None Diabetes: No  How often do you need to have someone help you when you read instructions, pamphlets, or other written materials from your doctor or pharmacy?: 3 - Sometimes  Diabetic? no  Interpreter Needed?: No  Information entered by :: NAllen LPN   Activities of Daily Living    08/11/2022    3:36 PM  In your present state of health, do you have any difficulty performing the following activities:  Hearing? 0  Vision? 1  Difficulty concentrating or making decisions? 0  Walking or climbing stairs? 0  Dressing or bathing? 0  Doing errands, shopping? 1  Comment decreased vision  Preparing Food and eating ? N  Using the Toilet? N  In the past six months, have you accidently leaked urine? Y  Do you have problems with loss of bowel control? N  Managing your Medications? N  Managing your Finances? N  Housekeeping or managing your Housekeeping? N    Patient Care  Team: Loyola Mast, MD as PCP - General (Family Medicine)  Indicate any recent Medical Services you may have received from other than [facility] providers in the past year (date may be approximate).     Assessment:   This is a routine wellness examination for Jennifer [name].  Hearing/Vision screen Vision Screening - Comments:: No regular eye exams, [facility]  Dietary issues and exercise activities discussed: Current Exercise Habits: The patient does not participate in regular exercise at present   Goals Addressed  This Visit's Progress    Patient Stated       08/11/2022, no goals       Depression Screen    08/11/2022    3:36 PM 06/30/2022   10:02 AM 12/28/2020   10:08 AM 04/05/2020    1:56 PM  PHQ 2/9 Scores  PHQ - 2 Score 0 4 2 0  PHQ- 9 Score  14 8     Fall Risk    08/11/2022    3:36 PM 06/30/2022   10:02 AM 12/28/2020   10:07 AM 04/05/2020    1:56 PM  Fall Risk   Falls in the past year? 0 0 0   Number falls in past yr: 0 0 0 0  Injury with Fall? 0 0 0 0  Risk for fall due to : Impaired vision;Medication side effect No Fall Risks No Fall Risks   Follow up Falls prevention discussed;Education provided;Falls evaluation completed Falls evaluation completed Falls evaluation completed     FALL RISK PREVENTION PERTAINING TO THE HOME:  Any stairs in or around the home? Yes  If so, are there any without handrails? No  Home free of loose throw rugs in walkways, pet beds, electrical cords, etc? Yes  Adequate lighting in your home to reduce risk of falls? Yes   ASSISTIVE DEVICES UTILIZED TO PREVENT FALLS:  Life alert? No  Use of a cane, walker or w/c? No  Grab bars in the bathroom? No  Shower chair or bench in shower? No  Elevated toilet seat or a handicapped toilet? Yes   TIMED UP AND GO:  Was the test performed? No .      Cognitive Function:        08/11/2022    3:38 PM 12/28/2020   10:25 AM  6CIT Screen  What Year? 0 points 0 points  What  month? 0 points 0 points  What time? 0 points 0 points  Count back from 20 0 points 0 points  Months in reverse 0 points 0 points  Repeat phrase 0 points 10 points  Total Score 0 points 10 points    Immunizations Immunization History  Administered Date(s) Administered   Pneumococcal Conjugate-13 07/05/2015   Pneumococcal Polysaccharide-23 07/24/2017    TDAP status: Due, Education has been provided regarding the importance of this vaccine. Advised may receive this vaccine at local pharmacy or Health Dept. Aware to provide a copy of the vaccination record if obtained from local pharmacy or Health Dept. Verbalized acceptance and understanding.  Flu Vaccine status: Declined, Education has been provided regarding the importance of this vaccine but patient still declined. Advised may receive this vaccine at local pharmacy or Health Dept. Aware to provide a copy of the vaccination record if obtained from local pharmacy or Health Dept. Verbalized acceptance and understanding.  Pneumococcal vaccine status: Up to date  Covid-19 vaccine status: Declined, Education has been provided regarding the importance of this vaccine but patient still declined. Advised may receive this vaccine at local pharmacy or Health Dept.or vaccine clinic. Aware to provide a copy of the vaccination record if obtained from local pharmacy or Health Dept. Verbalized acceptance and understanding.  Qualifies for Shingles Vaccine? Yes   Zostavax completed No   Shingrix Completed?: No.    Education has been provided regarding the importance of this vaccine. Patient has been advised to call insurance company to determine out of pocket expense if they have not yet received this vaccine. Advised may also receive vaccine at local pharmacy or  Health Dept. Verbalized acceptance and understanding.  Screening Tests Health Maintenance  Topic Date Due   DTaP/Tdap/Td (1 - Tdap) Never done   Zoster Vaccines- Shingrix (1 of 2) Never done    DEXA SCAN  Never done   Medicare Annual Wellness (AWV)  12/28/2021   COVID-19 Vaccine (1) 08/13/2022 (Originally 03/17/1934)   INFLUENZA VACCINE  11/20/2022   Pneumonia Vaccine 68+ Years old  Completed   HPV VACCINES  Aged Out    Health Maintenance  Health Maintenance Due  Topic Date Due   DTaP/Tdap/Td (1 - Tdap) Never done   Zoster Vaccines- Shingrix (1 of 2) Never done   DEXA SCAN  Never done   Medicare Annual Wellness (AWV)  12/28/2021    Colorectal cancer screening: No longer required.   Mammogram status: No longer required due to age.  Bone Density status: due  Lung Cancer Screening: (Low Dose CT Chest recommended if Age 21-80 years, 30 pack-year currently smoking OR have quit w/in 15years.) does not qualify.   Lung Cancer Screening Referral: no  Additional Screening:  Hepatitis C Screening: does not qualify;   Vision Screening: Recommended annual ophthalmology exams for early detection of glaucoma and other disorders of the eye. Is the patient up to date with their annual eye exam?  No  Who is the provider or what is the name of the office in which the patient attends annual eye exams? [facility] If pt is not established with a provider, would they like to be referred to a provider to establish care? No .   Dental Screening: Recommended annual dental exams for proper oral hygiene  Community Resource Referral / Chronic Care Management: CRR required this visit?  No   CCM required this visit?  No      Plan:     I have personally reviewed and noted the following in the patient's chart:   Medical and social history Use of alcohol, tobacco or illicit drugs  Current medications and supplements including opioid prescriptions. Patient is not currently taking opioid prescriptions. Functional ability and status Nutritional status Physical activity Advanced directives List of other physicians Hospitalizations, surgeries, and ER visits in previous 12  months Vitals Screenings to include cognitive, depression, and falls Referrals and appointments  In addition, I have reviewed and discussed with patient certain preventive protocols, quality metrics, and best practice recommendations. A written personalized care plan for preventive services as well as general preventive health recommendations were provided to patient.     Barb Merino, LPN   1/61/0960   Nurse Notes: none  Due to this being a virtual visit, the after visit summary with patients personalized plan was offered to patient via mail or my-chart.  to pick up at office at next visit

## 2022-08-11 NOTE — Patient Instructions (Signed)
Jennifer [name] , Thank you for taking time to come for your Medicare Wellness Visit. I appreciate your ongoing commitment to your health goals. Please review the following plan we discussed and let me know if I can assist you in the future.   These are the goals we discussed:  Goals      Patient Stated     08/11/2022, no goals        This is a list of the screening recommended for you and due dates:  Health Maintenance  Topic Date Due   DTaP/Tdap/Td vaccine (1 - Tdap) Never done   Zoster (Shingles) Vaccine (1 of 2) Never done   DEXA scan (bone density measurement)  Never done   COVID-19 Vaccine (1) 08/13/2022*   Flu Shot  11/20/2022   Medicare Annual Wellness Visit  08/11/2023   Pneumonia Vaccine  Completed   HPV Vaccine  Aged Out  *Topic was postponed. The date shown is not the original due date.    Advanced directives: Advance directive discussed with you today.   Conditions/risks identified: none  Next appointment: Follow up in one year for your annual wellness visit    Preventive Care 65 Years and Older, Female Preventive care refers to lifestyle choices and visits with your health care provider that can promote health and wellness. What does preventive care include? A yearly physical exam. This is also called an annual well check. Dental exams once or twice a year. Routine eye exams. Ask your health care provider how often you should have your eyes checked. Personal lifestyle choices, including: Daily care of your teeth and gums. Regular physical activity. Eating a healthy diet. Avoiding tobacco and drug use. Limiting alcohol use. Practicing safe sex. Taking low-dose aspirin every day. Taking vitamin and mineral supplements as recommended by your health care provider. What happens during an annual well check? The services and screenings done by your health care provider during your annual well check will depend on your age, overall health, lifestyle risk factors,  and family history of disease. Counseling  Your health care provider may ask you questions about your: Alcohol use. Tobacco use. Drug use. Emotional well-being. Home and relationship well-being. Sexual activity. Eating habits. History of falls. Memory and ability to understand (cognition). Work and work Astronomer. Reproductive health. Screening  You may have the following tests or measurements: Height, weight, and BMI. Blood pressure. Lipid and cholesterol levels. These may be checked every 5 years, or more frequently if you are over 24 years old. Skin check. Lung cancer screening. You may have this screening every year starting at age 31 if you have a 30-pack-year history of smoking and currently smoke or have quit within the past 15 years. Fecal occult blood test (FOBT) of the stool. You may have this test every year starting at age 57. Flexible sigmoidoscopy or colonoscopy. You may have a sigmoidoscopy every 5 years or a colonoscopy every 10 years starting at age 50. Hepatitis C blood test. Hepatitis B blood test. Sexually transmitted disease (STD) testing. Diabetes screening. This is done by checking your blood sugar (glucose) after you have not eaten for a while (fasting). You may have this done every 1-3 years. Bone density scan. This is done to screen for osteoporosis. You may have this done starting at age 19. Mammogram. This may be done every 1-2 years. Talk to your health care provider about how often you should have regular mammograms. Talk with your health care provider about your test results, treatment  options, and if necessary, the need for more tests. Vaccines  Your health care provider may recommend certain vaccines, such as: Influenza vaccine. This is recommended every year. Tetanus, diphtheria, and acellular pertussis (Tdap, Td) vaccine. You may need a Td booster every 10 years. Zoster vaccine. You may need this after age 67. Pneumococcal 13-valent conjugate  (PCV13) vaccine. One dose is recommended after age 27. Pneumococcal polysaccharide (PPSV23) vaccine. One dose is recommended after age 4. Talk to your health care provider about which screenings and vaccines you need and how often you need them. This information is not intended to replace advice given to you by your health care provider. Make sure you discuss any questions you have with your health care provider. Document Released: 05/04/2015 Document Revised: 12/26/2015 Document Reviewed: 02/06/2015 Elsevier Interactive Patient Education  2017 [place] Prevention in the Home Falls can cause injuries. They can happen to people of all ages. There are many things you can do to make your home safe and to help prevent falls. What can I do on the outside of my home? Regularly fix the edges of walkways and driveways and fix any cracks. Remove anything that might make you trip as you walk through a door, such as a raised step or threshold. Trim any bushes or trees on the path to your home. Use bright outdoor lighting. Clear any walking paths of anything that might make someone trip, such as rocks or tools. Regularly check to see if handrails are loose or broken. Make sure that both sides of any steps have handrails. Any raised decks and porches should have guardrails on the edges. Have any leaves, snow, or ice cleared regularly. Use sand or salt on walking paths during winter. Clean up any spills in your garage right away. This includes oil or grease spills. What can I do in the bathroom? Use night lights. Install grab bars by the toilet and in the tub and shower. Do not use towel bars as grab bars. Use non-skid mats or decals in the tub or shower. If you need to sit down in the shower, use a plastic, non-slip stool. Keep the floor dry. Clean up any water that spills on the floor as soon as it happens. Remove soap buildup in the tub or shower regularly. Attach bath mats securely with  double-sided non-slip rug tape. Do not have throw rugs and other things on the floor that can make you trip. What can I do in the bedroom? Use night lights. Make sure that you have a light by your bed that is easy to reach. Do not use any sheets or blankets that are too big for your bed. They should not hang down onto the floor. Have a firm chair that has side arms. You can use this for support while you get dressed. Do not have throw rugs and other things on the floor that can make you trip. What can I do in the kitchen? Clean up any spills right away. Avoid walking on wet floors. Keep items that you use a lot in easy-to-reach places. If you need to reach something above you, use a strong step stool that has a grab bar. Keep electrical cords out of the way. Do not use floor polish or wax that makes floors slippery. If you must use wax, use non-skid floor wax. Do not have throw rugs and other things on the floor that can make you trip. What can I do with my stairs? Do not  leave any items on the stairs. Make sure that there are handrails on both sides of the stairs and use them. Fix handrails that are broken or loose. Make sure that handrails are as long as the stairways. Check any carpeting to make sure that it is firmly attached to the stairs. Fix any carpet that is loose or worn. Avoid having throw rugs at the top or bottom of the stairs. If you do have throw rugs, attach them to the floor with carpet tape. Make sure that you have a light switch at the top of the stairs and the bottom of the stairs. If you do not have them, ask someone to add them for you. What else can I do to help prevent falls? Wear shoes that: Do not have high heels. Have rubber bottoms. Are comfortable and fit you well. Are closed at the toe. Do not wear sandals. If you use a stepladder: Make sure that it is fully opened. Do not climb a closed stepladder. Make sure that both sides of the stepladder are locked  into place. Ask someone to hold it for you, if possible. Clearly mark and make sure that you can see: Any grab bars or handrails. First and last steps. Where the edge of each step is. Use tools that help you move around (mobility aids) if they are needed. These include: Canes. Walkers. Scooters. Crutches. Turn on the lights when you go into a dark area. Replace any light bulbs as soon as they burn out. Set up your furniture so you have a clear path. Avoid moving your furniture around. If any of your floors are uneven, fix them. If there are any pets around you, be aware of where they are. Review your medicines with your doctor. Some medicines can make you feel dizzy. This can increase your chance of falling. Ask your doctor what other things that you can do to help prevent falls. This information is not intended to replace advice given to you by your health care provider. Make sure you discuss any questions you have with your health care provider. Document Released: 02/01/2009 Document Revised: 09/13/2015 Document Reviewed: 05/12/2014 Elsevier Interactive Patient Education  2017 [employer].

## 2022-10-27 ENCOUNTER — Ambulatory Visit (INDEPENDENT_AMBULATORY_CARE_PROVIDER_SITE_OTHER): Payer: Medicare Other | Admitting: Family Medicine

## 2022-10-27 ENCOUNTER — Encounter: Payer: Self-pay | Admitting: Family Medicine

## 2022-10-27 VITALS — BP 136/78 | HR 86 | Temp 97.6°F | Ht 63.0 in | Wt 137.0 lb

## 2022-10-27 DIAGNOSIS — F419 Anxiety disorder, unspecified: Secondary | ICD-10-CM | POA: Diagnosis not present

## 2022-10-27 DIAGNOSIS — J4520 Mild intermittent asthma, uncomplicated: Secondary | ICD-10-CM

## 2022-10-27 DIAGNOSIS — G43709 Chronic migraine without aura, not intractable, without status migrainosus: Secondary | ICD-10-CM | POA: Diagnosis not present

## 2022-10-27 DIAGNOSIS — J40 Bronchitis, not specified as acute or chronic: Secondary | ICD-10-CM | POA: Diagnosis not present

## 2022-10-27 DIAGNOSIS — I10 Essential (primary) hypertension: Secondary | ICD-10-CM | POA: Diagnosis not present

## 2022-10-27 MED ORDER — DIAZEPAM 5 MG PO TABS
2.5000 mg | ORAL_TABLET | Freq: Every day | ORAL | 2 refills | Status: DC | PRN
Start: 2022-10-27 — End: 2023-02-03

## 2022-10-27 MED ORDER — BUTALBITAL-APAP-CAFFEINE 50-325-40 MG PO TABS
1.0000 | ORAL_TABLET | Freq: Every day | ORAL | 2 refills | Status: AC | PRN
Start: 2022-10-27 — End: ?

## 2022-10-27 MED ORDER — ALBUTEROL SULFATE HFA 108 (90 BASE) MCG/ACT IN AERS
2.00 | INHALATION_SPRAY | Freq: Four times a day (QID) | RESPIRATORY_TRACT | 3 refills | Status: AC | PRN
Start: 2022-10-27 — End: ?

## 2022-10-27 NOTE — Assessment & Plan Note (Signed)
We will continue occasional Fioricet use for now.

## 2022-10-27 NOTE — Assessment & Plan Note (Signed)
I will continue limited amounts of Valium for management.

## 2022-10-27 NOTE — Assessment & Plan Note (Signed)
Ms. Kampman has been intolerant of several BP meds. For now, her BP is adequately controlled. We will focus on non-pharmacologic approaches.

## 2022-10-27 NOTE — Progress Notes (Signed)
[facility] [address] [place] [address] Dept: [phone] Dept Fax: [phone]  Chronic Care Office Visit  Subjective:    Patient ID: Jennifer [name], female    DOB: 02/17/1934, 89 y.o..   MRN: [id number]  Chief Complaint  Patient presents with   Medical Management of Chronic Issues    3 month f/u.   C/o having cough/congestion x 2-3 weeks..  having HA's and has been taking Tylenol and Albuterol.   History of Present Illness:  Patient is in today for reassessment of chronic medical issues.  Jennifer [name] has a history of daily headaches, characterized as migraines. Since her last appointment, she was unable to get her Fioricet filled, so has not been using this. She has been taking Tylenol 6 times a day.   Jennifer [name] has a history of anxiety and depression. She has been managed long-term on Valium typically taking 1/2 a tablet 3-4 times a week. A   Jennifer [name] has essential hypertension. We had tried her on lisinopril and then losartan, neither of which did she tolerate. She would like to try avoiding salt and caffeine for now.   Jennifer [name] notes she has had an issue with a cough over the past 3 weeks. She thinks some of this was stirred up as she had been mowing grass on her property. She is unsure of what she might take to try and help.   Past Medical History: Patient Active Problem List   Diagnosis Date Noted   Essential hypertension 06/30/2022   Eczema 06/30/2022   Venous insufficiency 06/30/2022   Nuclear sclerotic cataract of both eyes 02/18/2016   Regular astigmatism of both eyes 01/03/2016   Presbyopia of both eyes 01/03/2016   Myopia of both eyes 01/03/2016   Keratoconjunctivitis sicca of both eyes not specified as Sjogren's 01/03/2016   Exudative age-related macular degeneration (HCC) 07/08/2015   Idiopathic scoliosis of thoracolumbar spine 02/16/2015   Degenerative disc disease, lumbar 02/16/2015    Decreased peripheral vision of left eye 01/04/2015   Short-segment Barrett's esophagus 03/03/2014   Sciatica 03/03/2014   Primary osteoarthritis of right knee 03/03/2014   Primary open-angle glaucoma 03/03/2014   Other headache syndrome 03/03/2014   Ischemic optic neuropathy 03/03/2014   Irritable bowel syndrome 03/03/2014   Gastro-esophageal reflux disease with esophagitis 03/03/2014   Functional dyspepsia 03/03/2014   Depression 03/03/2014   Chronic nausea 03/03/2014   Chronic migraine without aura 03/03/2014   Allergic rhinitis 03/03/2014   Hyperlipidemia LDL goal <130 12/23/2013   Glaucoma 12/23/2013   Asthma 12/23/2013   Anxiety 12/23/2013   Past Surgical History:  Procedure Laterality Date   CHOLECYSTECTOMY     LEG SKIN LESION  BIOPSY / EXCISION     TONSILECTOMY, ADENOIDECTOMY, BILATERAL MYRINGOTOMY AND TUBES     TRABECULECTOMY Left    Family History  Problem Relation Age of Onset   Diabetes Mother    Stroke Mother    Alcohol abuse Father    Heart disease Father    Heart disease Sister    Diabetes Sister    Heart disease Brother    Diabetes Brother    Heart disease Brother    Diabetes Brother    Colon cancer Neg Hx    Esophageal cancer Neg Hx    Rectal cancer Neg Hx    Stomach cancer Neg Hx    Outpatient Medications Prior to Visit  Medication Sig Dispense Refill   acetaminophen (TYLENOL) 500 MG tablet Take 500  mg by mouth 2 (two) times daily as needed for moderate pain or headache.      dicyclomine (BENTYL) 10 MG capsule Take 2 capsules (20 mg total) by mouth 2 (two) times daily before a meal. 360 capsule 3   latanoprost (XALATAN) 0.005 % ophthalmic solution Place 1 drop into both eyes at bedtime.      pantoprazole (PROTONIX) 40 MG tablet Take 1 tablet (40 mg total) by mouth daily. 90 tablet 4   triamcinolone cream (KENALOG) 0.1 % Apply 1 Application topically 2 (two) times daily. 30 g 0   albuterol (VENTOLIN HFA) 108 (90 Base) MCG/ACT inhaler Inhale 2 puffs  into the lungs every 6 (six) hours as needed for wheezing or shortness of breath.     butalbital-acetaminophen-caffeine (FIORICET) 50-325-40 MG tablet Take 1 tablet by mouth daily as needed for headache. 30 tablet 2   diazepam (VALIUM) 5 MG tablet Take 0.5 tablets (2.5 mg total) by mouth daily as needed for anxiety. 15 tablet 2   losartan (COZAAR) 25 MG tablet Take 1 tablet (25 mg total) by mouth daily. (Patient not taking: Reported on 08/11/2022) 30 tablet 5   Facility-Administered Medications Prior to Visit  Medication Dose Route Frequency Provider Last Rate Last Admin   0.9 %  sodium chloride infusion  500 mL Intravenous Once Pyrtle, Carie Caddy, MD       Allergies  Allergen Reactions   Lisinopril Diarrhea   Erythromycin Rash and Other (See Comments)    Blisters   Objective:   Today's Vitals   10/27/22 1340  BP: 136/78  Pulse: 86  Temp: 97.6 F (36.4 C)  TempSrc: Temporal  SpO2: 98%  Weight: 137 lb (62.1 kg)  Height: 5\' 3"  (1.6 m)   Body mass index is 24.27 kg/m.   General: Well developed, well nourished. No acute distress. HEENT: Normocephalic, non-traumatic. External ears normal. EAC and TMs normal bilaterally. PERRL, EOMI.   Conjunctiva clear. Nose clear without congestion or rhinorrhea. Mucous membranes moist. Oropharynx clear.   Good dentition. Neck: Supple. No lymphadenopathy. No thyromegaly. Lungs: Clear to auscultation bilaterally. No wheezing, rales or rhonchi. CV: RRR without murmurs or rubs. Pulses 2+ bilaterally. Psych: Alert and oriented. Normal mood and affect.  Health Maintenance Due  Topic Date Due   DTaP/Tdap/Td (1 - Tdap) Never done   Zoster Vaccines- Shingrix (1 of 2) Never done   DEXA SCAN  Never done     Assessment & Plan:   Problem List Items Addressed This Visit       Cardiovascular and Mediastinum   Chronic migraine without aura    We will continue occasional Fioricet use for now.      Relevant Medications    butalbital-acetaminophen-caffeine (FIORICET) 50-325-40 MG tablet   Essential hypertension - Primary    Jennifer [name] has been intolerant of several BP meds. For now, her BP is adequately controlled. We will focus on non-pharmacologic approaches.        Respiratory   Asthma    Stable. I will renew her albuterol.      Relevant Medications   albuterol (VENTOLIN HFA) 108 (90 Base) MCG/ACT inhaler     Other   Anxiety    I will continue limited amounts of Valium for management.      Relevant Medications   diazepam (VALIUM) 5 MG tablet   Other Visit Diagnoses     Bronchitis       Recommend she try some Delsym to see if this will loosen her  cough.       Return in about 3 months (around 01/27/2023) for Reassessment.   Loyola Mast, MD

## 2022-10-27 NOTE — Assessment & Plan Note (Signed)
Stable. I will renew her albuterol.

## 2023-01-16 ENCOUNTER — Telehealth: Payer: Self-pay | Admitting: Family Medicine

## 2023-01-16 ENCOUNTER — Other Ambulatory Visit: Payer: Self-pay

## 2023-01-16 DIAGNOSIS — G43709 Chronic migraine without aura, not intractable, without status migrainosus: Secondary | ICD-10-CM

## 2023-01-16 MED ORDER — BUTALBITAL-APAP-CAFFEINE 50-325-40 MG PO TABS
1.0000 | ORAL_TABLET | Freq: Every day | ORAL | 2 refills | Status: DC | PRN
Start: 2023-01-16 — End: 2023-01-16

## 2023-01-16 MED ORDER — BUTALBITAL-APAP-CAFFEINE 50-325-40 MG PO TABS
1.0000 | ORAL_TABLET | Freq: Every day | ORAL | 2 refills | Status: DC | PRN
Start: 2023-01-16 — End: 2023-05-20

## 2023-01-16 NOTE — Telephone Encounter (Signed)
Refill request for  Fioricet  LR 10/28/22, #30, 2 rf LOV 11/17/22 FOV 01/27/23  Please review and advise.  Thanks. Dm/cma

## 2023-01-16 NOTE — Telephone Encounter (Signed)
Prescription Request  01/16/2023  LOV: 10/27/2022  What is the name of the medication or equipment? butalbital-acetaminophen-caffeine (FIORICET) 50-325-40 MG tablet [[id number]]   Have you contacted your pharmacy to request a refill? No   Which pharmacy would you like this sent to?   [facility] [address] [place], [place] - [address] AT [facility] [address] [place]. [place] [address] Phone: [phone] Fax: [phone]  Patient notified that their request is being sent to the clinical staff for review and that they should receive a response within 2 business days.   Please advise at Mobile  [phone]

## 2023-01-16 NOTE — Telephone Encounter (Signed)
Can you please and thank you resend this to the [facility].   Thanks.  Dm/cma

## 2023-01-16 NOTE — Telephone Encounter (Signed)
Pt called and stated she no longer use the [facility] on [address] . She now use the [facility] pharmacy by [address] so can you resend her medication to the [facility] pharmacy . ( butalbital-acetaminophen-caffeine (FIORICET) 50-325-40 MG tablet [id number] )

## 2023-01-27 ENCOUNTER — Ambulatory Visit: Payer: Medicare Other | Admitting: Family Medicine

## 2023-02-03 ENCOUNTER — Other Ambulatory Visit: Payer: Self-pay

## 2023-02-03 DIAGNOSIS — F419 Anxiety disorder, unspecified: Secondary | ICD-10-CM

## 2023-02-03 MED ORDER — DIAZEPAM 5 MG PO TABS
2.5000 mg | ORAL_TABLET | Freq: Every day | ORAL | 2 refills | Status: DC | PRN
Start: 2023-02-03 — End: 2023-07-20

## 2023-02-03 NOTE — Telephone Encounter (Signed)
Refill request for  Diazepam 5 mg LR  10/27/22, @15 , 2 rf LOV 10/27/22 FOV 08/14/23  Please review and advise.  Thanks. Dm/cma

## 2023-05-08 IMAGING — DX DG LUMBAR SPINE COMPLETE 4+V
5 series · 5 of 5 positions shown · non-contrast
Comparison: None.

CLINICAL DATA: Please include AP, lateral, obliques, and odontoid
views.; Please include AP, Lateral, obliques, and lumbosacral spot
views.; Need AP, lateral, and swimmer's view of the thoracic spine.

EXAM:
CERVICAL SPINE - COMPLETE 4+ VIEW; LUMBAR SPINE - COMPLETE 4+ VIEW;
THORACIC SPINE - 3 VIEWS

[l-spine ap]
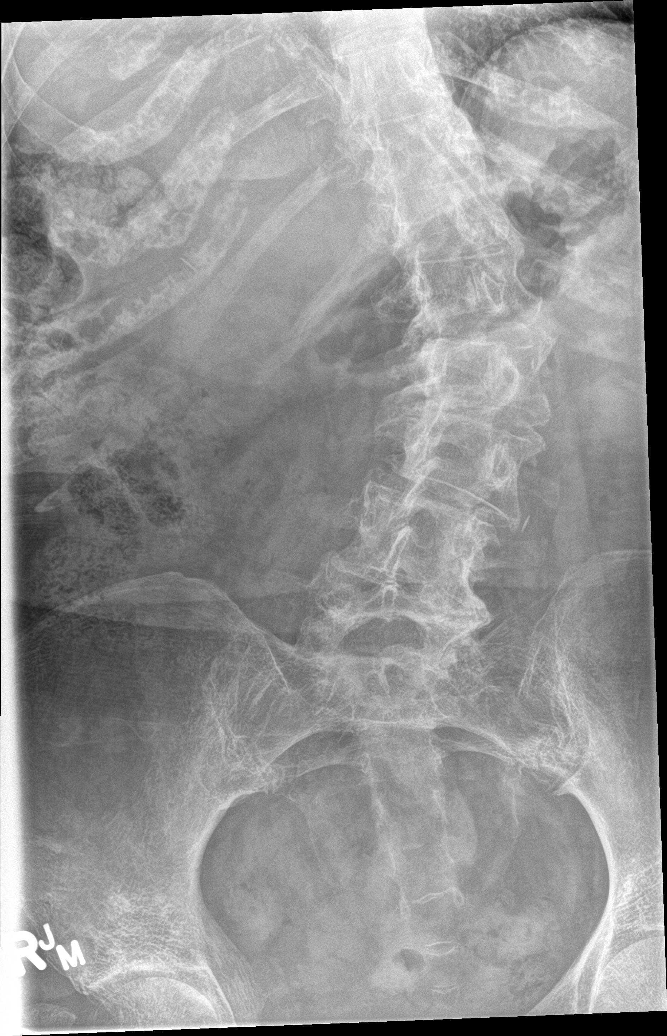

[l-spine obl (1 of 2)]
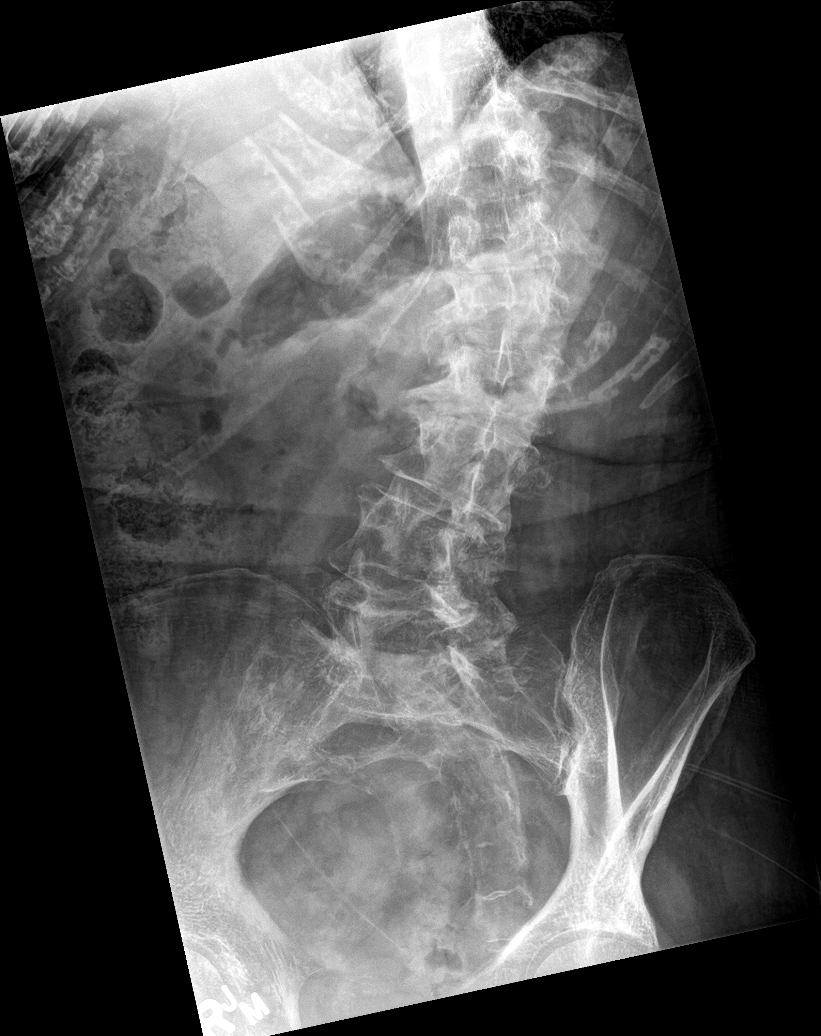

[l-spine obl (2 of 2)]
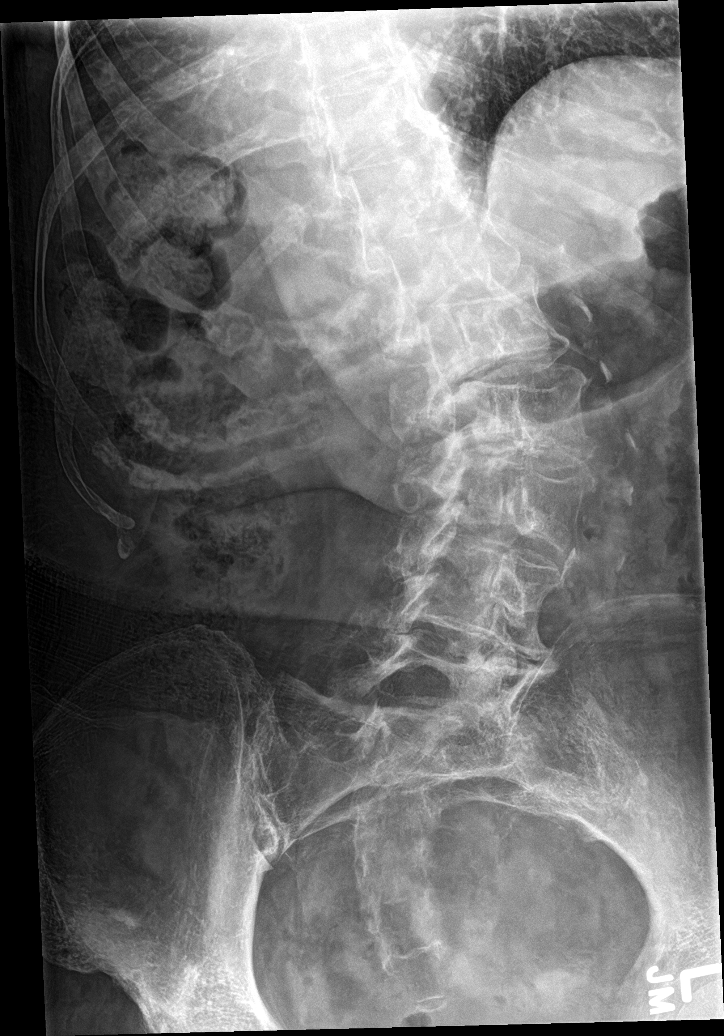

[l-spine lat]
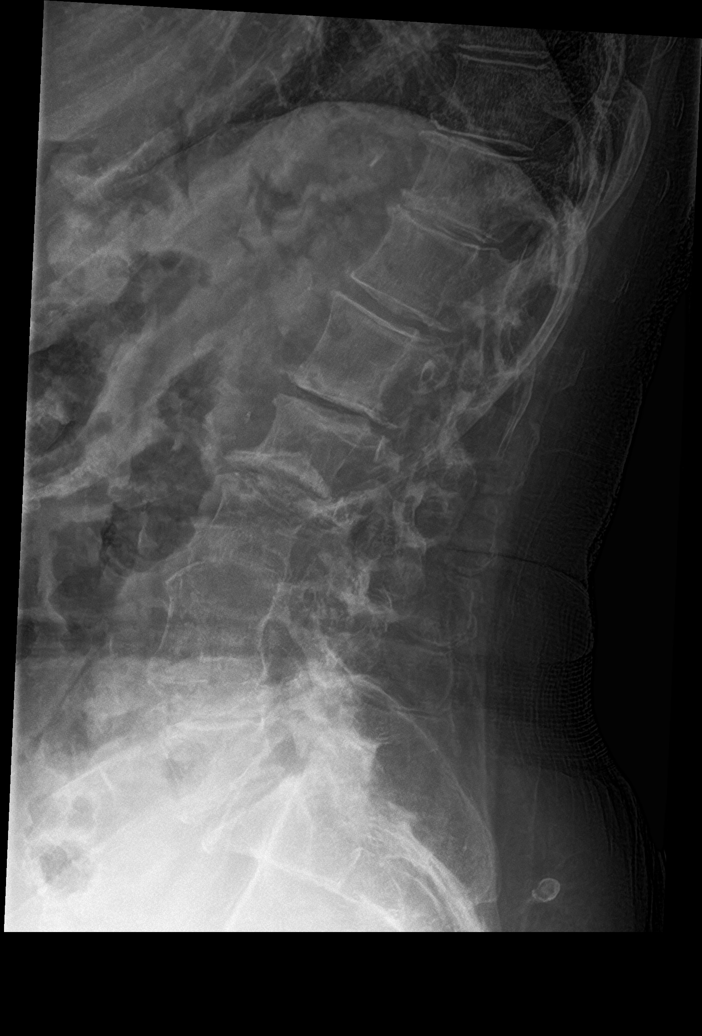

[l-spine spot]
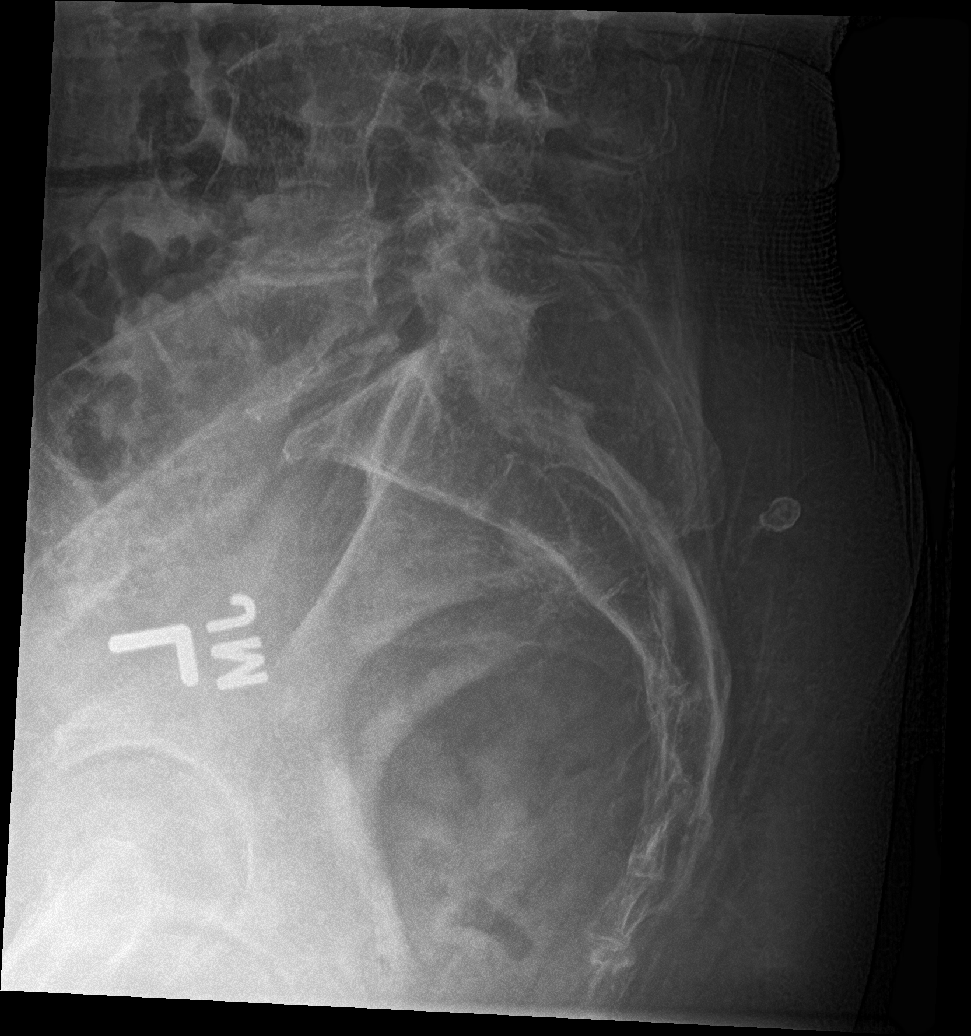

[5 of 5 positions shown; findings below may reference images not displayed]

FINDINGS: Cervical spine:

Cervical spine is well visualized to the cervicothoracic junction on
the lateral view. Cervical spine alignment is within normal limits.
The cervical vertebral body heights are maintained. The
atlantodental relationship is preserved. Multilevel degenerative
changes of the lower cervical spine appear most pronounced at least
moderate in severity at C5-C6. There is right-sided facet
arthropathy at C5-C6 and C6-C7. The prevertebral soft tissues are
unremarkable. Osteopenia.

Thoracic spine:

There are 12 rib-bearing thoracic type vertebral bodies. There is
sigmoid scoliosis of the spine at the thoracolumbar junction. The
thoracic vertebral body heights appear maintained. Osteopenia.

Lumbar spine:

There appear to be 5 non-rib-bearing lumbar-type vertebral bodies.
Sigmoid scoliosis of the thoracolumbar junction. Multilevel
degenerative disc changes are most advanced and severe at L2-L3.
There are more mild degenerative disc changes at L5-S1 with
associated facet arthropathy. The lumbar vertebral body heights
appear maintained. Osteopenia.
IMPRESSION: Cervical, thoracic, lumbar spine:

1. Multilevel degenerative changes of the lower cervical spine
appear most pronounced at least moderate at C5-C6. Right-sided facet
arthropathy at C5-C6 and C6-C7.
2. Sigmoid scoliosis of the thoracic and lumbar spine centered at
the thoracolumbar junction.
3. Multilevel degenerative changes of the lumbar spine, including
severe disc disease at L2-L3 and facet arthropathy at L5-S1.
4. Diffuse osteopenia.

## 2023-05-08 IMAGING — DX DG CERVICAL SPINE COMPLETE 4+V
5 series · 5 of 5 positions shown · non-contrast
Comparison: None.

CLINICAL DATA: Please include AP, lateral, obliques, and odontoid
views.; Please include AP, Lateral, obliques, and lumbosacral spot
views.; Need AP, lateral, and swimmer's view of the thoracic spine.

EXAM:
CERVICAL SPINE - COMPLETE 4+ VIEW; LUMBAR SPINE - COMPLETE 4+ VIEW;
THORACIC SPINE - 3 VIEWS

[c-spine lat]
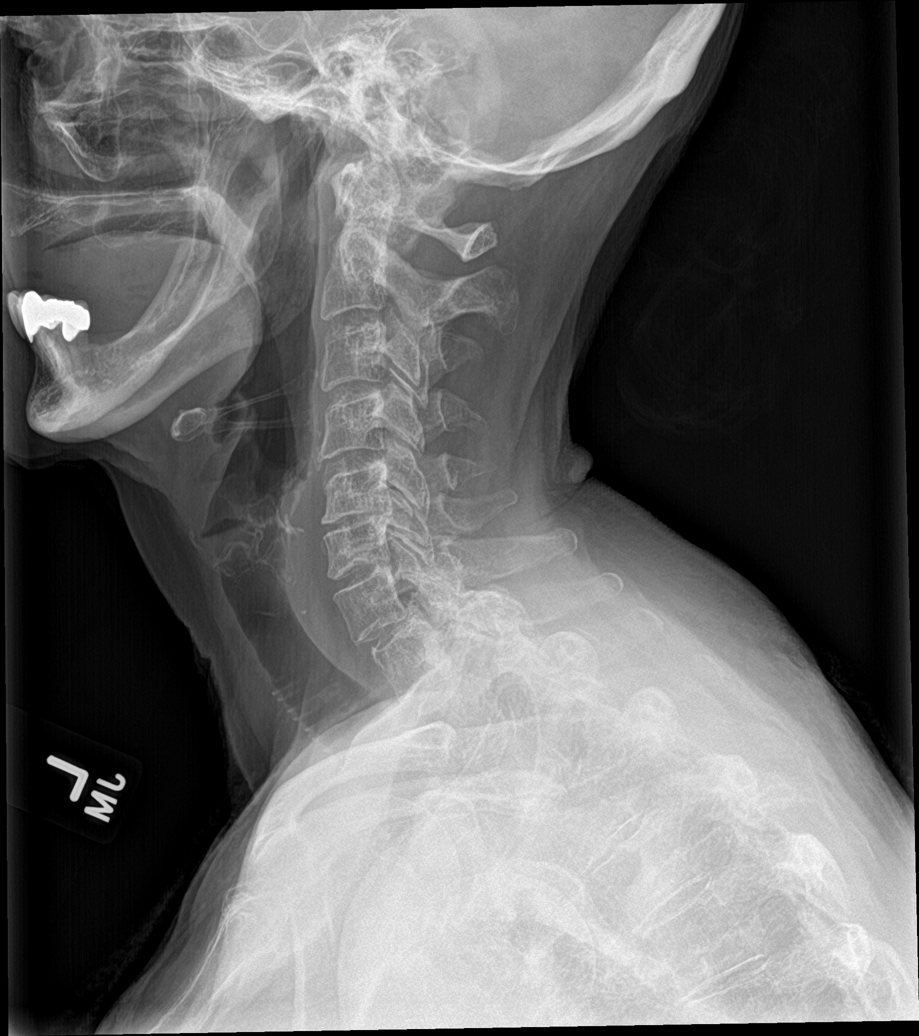

[c-spine obl (1 of 2)]
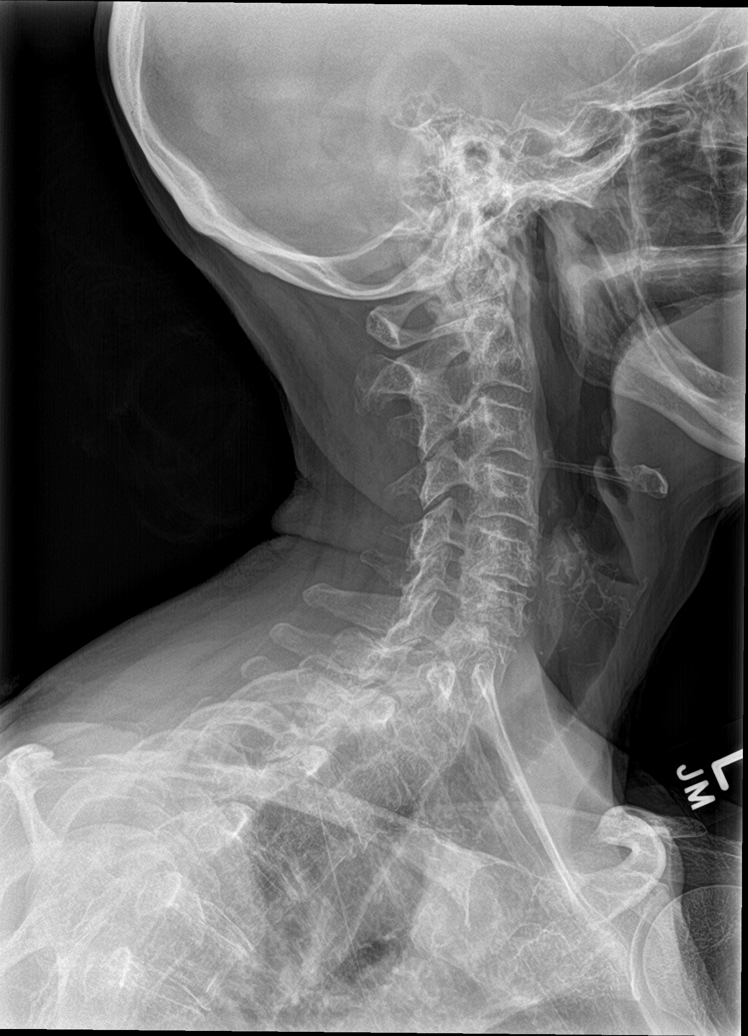

[c-spine obl (2 of 2)]
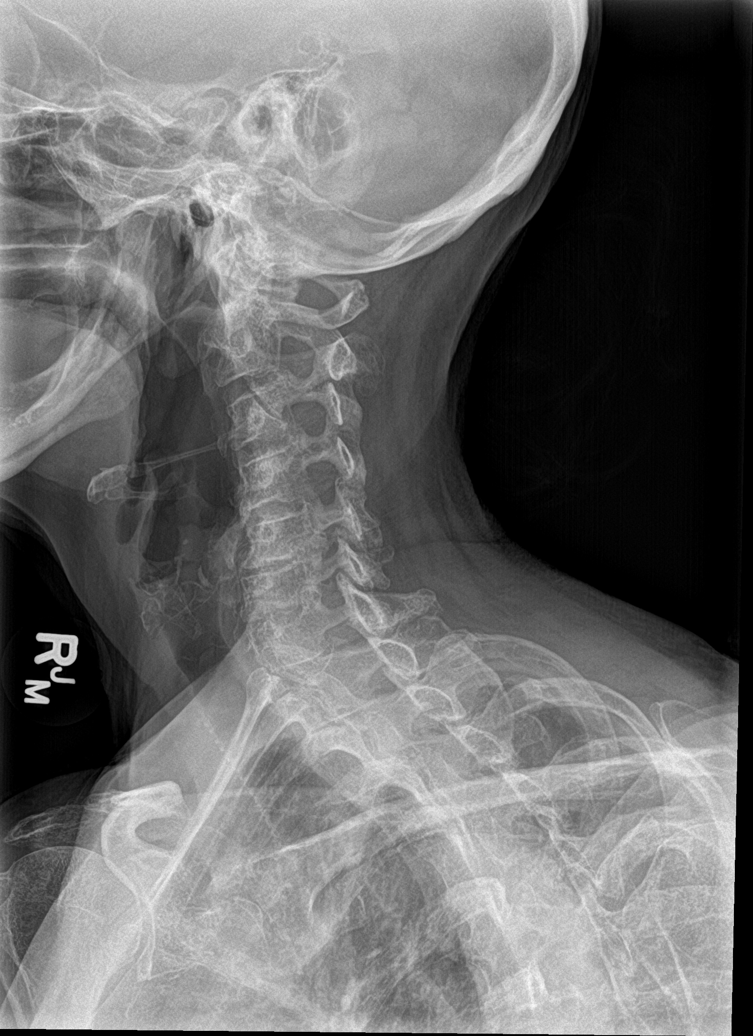

[c-spine ap]
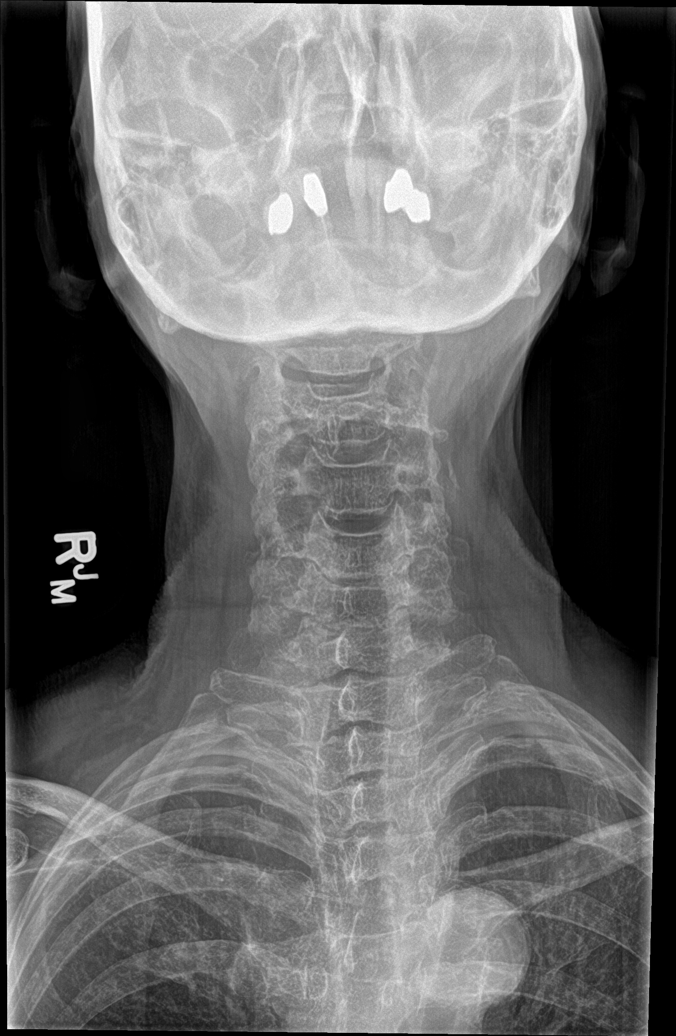

[c-spine open mouth]
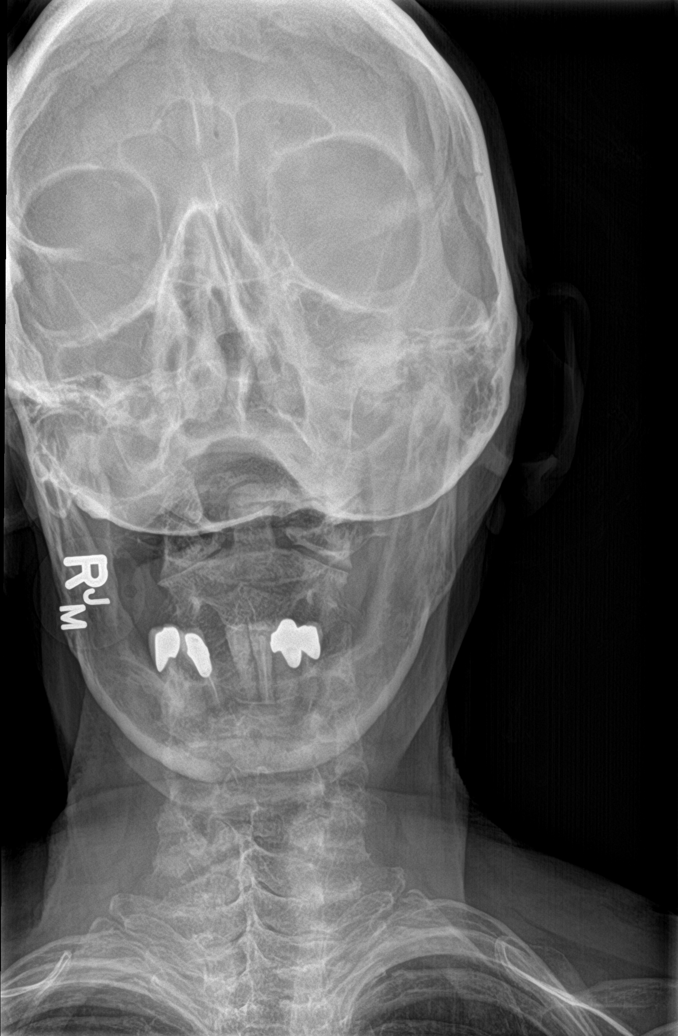

[5 of 5 positions shown; findings below may reference images not displayed]

FINDINGS: Cervical spine:

Cervical spine is well visualized to the cervicothoracic junction on
the lateral view. Cervical spine alignment is within normal limits.
The cervical vertebral body heights are maintained. The
atlantodental relationship is preserved. Multilevel degenerative
changes of the lower cervical spine appear most pronounced at least
moderate in severity at C5-C6. There is right-sided facet
arthropathy at C5-C6 and C6-C7. The prevertebral soft tissues are
unremarkable. Osteopenia.

Thoracic spine:

There are 12 rib-bearing thoracic type vertebral bodies. There is
sigmoid scoliosis of the spine at the thoracolumbar junction. The
thoracic vertebral body heights appear maintained. Osteopenia.

Lumbar spine:

There appear to be 5 non-rib-bearing lumbar-type vertebral bodies.
Sigmoid scoliosis of the thoracolumbar junction. Multilevel
degenerative disc changes are most advanced and severe at L2-L3.
There are more mild degenerative disc changes at L5-S1 with
associated facet arthropathy. The lumbar vertebral body heights
appear maintained. Osteopenia.
IMPRESSION: Cervical, thoracic, lumbar spine:

1. Multilevel degenerative changes of the lower cervical spine
appear most pronounced at least moderate at C5-C6. Right-sided facet
arthropathy at C5-C6 and C6-C7.
2. Sigmoid scoliosis of the thoracic and lumbar spine centered at
the thoracolumbar junction.
3. Multilevel degenerative changes of the lumbar spine, including
severe disc disease at L2-L3 and facet arthropathy at L5-S1.
4. Diffuse osteopenia.

## 2023-05-20 ENCOUNTER — Other Ambulatory Visit: Payer: Self-pay

## 2023-05-20 DIAGNOSIS — G43709 Chronic migraine without aura, not intractable, without status migrainosus: Secondary | ICD-10-CM

## 2023-05-20 MED ORDER — BUTALBITAL-APAP-CAFFEINE 50-325-40 MG PO TABS
1.0000 | ORAL_TABLET | Freq: Every day | ORAL | 0 refills | Status: DC | PRN
Start: 2023-05-20 — End: 2023-06-09

## 2023-05-20 NOTE — Telephone Encounter (Signed)
Refill request for  Butal-Acet-caff 50-325-40 mg LR 01/16/23, #30, 2 rf LOV  10/27/22 FOV  none scheduled.   Please review and advise.  Thanks. Dm/cma

## 2023-06-09 ENCOUNTER — Other Ambulatory Visit: Payer: Self-pay | Admitting: Family Medicine

## 2023-06-09 DIAGNOSIS — G43709 Chronic migraine without aura, not intractable, without status migrainosus: Secondary | ICD-10-CM

## 2023-06-09 NOTE — Telephone Encounter (Signed)
Refill request for Fioricet LR  05/20/23, #30, 0 rf LOV  10/27/22 FOV none scheduled.  Please review and advise.  Thanks. Dm/cma

## 2023-06-12 ENCOUNTER — Other Ambulatory Visit: Payer: Self-pay

## 2023-06-12 MED ORDER — PANTOPRAZOLE SODIUM 40 MG PO TBEC: 40.0000 mg | DELAYED_RELEASE_TABLET | Freq: Every day | ORAL | 0 refills | Status: DC

## 2023-07-20 ENCOUNTER — Other Ambulatory Visit: Payer: Self-pay

## 2023-07-20 DIAGNOSIS — F419 Anxiety disorder, unspecified: Secondary | ICD-10-CM

## 2023-07-20 MED ORDER — DIAZEPAM 5 MG PO TABS
2.5000 mg | ORAL_TABLET | Freq: Every day | ORAL | 2 refills | Status: AC | PRN
Start: 2023-07-20 — End: ?

## 2023-07-20 NOTE — Telephone Encounter (Signed)
 Refill request for  Diazepam 5 mg LR 06/09/23, #30, 1 rf LOV  10/27/22 FOV  none scheduled.      Please review and advise.  Thanks.  Dm/cma

## 2023-07-20 NOTE — Telephone Encounter (Signed)
 Spoke to patient and she will call back to schedule an appt after she talkts to the lady that brings her to her appts. Dm/cma

## 2023-08-14 ENCOUNTER — Ambulatory Visit: Payer: Medicare Other

## 2023-08-14 DIAGNOSIS — Z0000 Encounter for general adult medical examination without abnormal findings: Secondary | ICD-10-CM

## 2023-08-14 NOTE — Patient Instructions (Signed)
 Jennifer [name] , Thank you for taking time to come for your Medicare Wellness Visit. I appreciate your ongoing commitment to your health goals. Please review the following plan we discussed and let me know if I can assist you in the future.   Referrals/Orders/Follow-Ups/Clinician Recommendations: none  This is a list of the screening recommended for you and due dates:  Health Maintenance  Topic Date Due   DTaP/Tdap/Td vaccine (1 - Tdap) Never done   Zoster (Shingles) Vaccine (1 of 2) Never done   DEXA scan (bone density measurement)  Never done   COVID-19 Vaccine (1 - 2024-25 season) Never done   Flu Shot  11/20/2023   Medicare Annual Wellness Visit  08/13/2024   Pneumonia Vaccine  Completed   HPV Vaccine  Aged Out   Meningitis B Vaccine  Aged Out    Advanced directives: (ACP Link)Information on Advanced Care Planning can be found at [place]  Secretary of [facility] Advance Health Care Directives Advance Health Care Directives. [email or web address]   Next Medicare Annual Wellness Visit scheduled for next year: Yes  insert Preventive Care attachment Insert FALL PREVENTION attachment if needed

## 2023-08-14 NOTE — Progress Notes (Signed)
 Subjective:   Jennifer [name] is a 89 y.o. who presents for a Medicare Wellness preventive visit.  Visit Complete: Virtual I connected with  Jennifer [name] on 08/14/23 by a audio enabled telemedicine application and verified that I am speaking with the correct person using two identifiers.  Patient Location: Home  Provider Location: Office/Clinic  I discussed the limitations of evaluation and management by telemedicine. The patient expressed understanding and agreed to proceed.  Vital Signs: Because this visit was a virtual/telehealth visit, some criteria may be missing or patient reported. Any vitals not documented were not able to be obtained and vitals that have been documented are patient reported.  VideoError- Librarian, academic were attempted between this provider and patient, however failed, due to patient having technical difficulties OR patient did not have access to video capability.  We continued and completed visit with audio only.   Persons Participating in Visit: Patient.  AWV Questionnaire: No: Patient Medicare AWV questionnaire was not completed prior to this visit.  Cardiac Risk Factors include: advanced age (>34men, >60 women);dyslipidemia;hypertension     Objective:    Today's Vitals   There is no height or weight on file to calculate BMI.     08/14/2023    3:48 PM 08/11/2022    3:34 PM 12/28/2020   10:05 AM 05/10/2018   11:18 AM  Advanced Directives  Does Patient Have a Medical Advance Directive? No No No No  Would patient like information on creating a medical advance directive? No - Patient declined  No - Patient declined No - Patient declined    Current Medications (verified) Outpatient Encounter Medications as of 08/14/2023  Medication Sig   acetaminophen  (TYLENOL ) 500 MG tablet Take 500 mg by mouth 2 (two) times daily as needed for moderate pain or headache.    albuterol  (VENTOLIN  HFA) 108 (90 Base) MCG/ACT inhaler Inhale 2  puffs into the lungs every 6 (six) hours as needed for wheezing or shortness of breath.   butalbital -acetaminophen -caffeine  (FIORICET ) 50-325-40 MG tablet TAKE ONE TABLET BY MOUTH ONE TIME DAILY AS NEEDED FOR HEADACHE   diazepam  (VALIUM ) 5 MG tablet Take 0.5 tablets (2.5 mg total) by mouth daily as needed for anxiety.   dicyclomine  (BENTYL ) 10 MG capsule Take 2 capsules (20 mg total) by mouth 2 (two) times daily before a meal.   latanoprost (XALATAN) 0.005 % ophthalmic solution Place 1 drop into both eyes at bedtime.    pantoprazole  (PROTONIX ) 40 MG tablet Take 1 tablet (40 mg total) by mouth daily. Patient needs follow up appointment for future refills. Please call [phone] to schedule an appointment.   triamcinolone  cream (KENALOG ) 0.1 % Apply 1 Application topically 2 (two) times daily.   Facility-Administered Encounter Medications as of 08/14/2023  Medication   0.9 %  sodium chloride  infusion    Allergies (verified) Lisinopril  and Erythromycin   History: Past Medical History:  Diagnosis Date   Anxiety    Arthritis    Asthma    Cancer (HCC)    Cataract    Cholecystitis    Chronic migraine without aura    Degenerative disc disease at L5-S1 level    Depression    GERD (gastroesophageal reflux disease)    Glaucoma    Hiatal hernia    Hyperlipidemia    Irritable bowel    Neuromuscular disorder (HCC)    Osteoarthritis    Osteopenia    Scoliosis    Past Surgical History:  Procedure Laterality Date   CHOLECYSTECTOMY  LEG SKIN LESION  BIOPSY / EXCISION     TONSILECTOMY, ADENOIDECTOMY, BILATERAL MYRINGOTOMY AND TUBES     TRABECULECTOMY Left    Family History  Problem Relation Age of Onset   Diabetes Mother    Stroke Mother    Alcohol abuse Father    Heart disease Father    Heart disease Sister    Diabetes Sister    Heart disease Brother    Diabetes Brother    Heart disease Brother    Diabetes Brother    Colon cancer Neg Hx    Esophageal cancer Neg Hx     Rectal cancer Neg Hx    Stomach cancer Neg Hx    Social History   Socioeconomic History   Marital status: Widowed    Spouse name: Not on file   Number of children: 1   Years of education: Not on file   Highest education level: Not on file  Occupational History   Not on file  Tobacco Use   Smoking status: Never   Smokeless tobacco: Never  Vaping Use   Vaping status: Never Used  Substance and Sexual Activity   Alcohol use: Never   Drug use: Never   Sexual activity: Not Currently  Other Topics Concern   Not on file  Social History Narrative   Not on file   Social Drivers of Health   Financial Resource Strain: Low Risk  (08/14/2023)   Overall Financial Resource Strain (CARDIA)    Difficulty of Paying Living Expenses: Not hard at all  Food Insecurity: No Food Insecurity (08/14/2023)   Hunger Vital Sign    Worried About Running Out of Food in the Last Year: Never true    Ran Out of Food in the Last Year: Never true  Transportation Needs: No Transportation Needs (08/14/2023)   PRAPARE - Administrator, Civil Service (Medical): No    Lack of Transportation (Non-Medical): No  Physical Activity: Inactive (08/14/2023)   Exercise Vital Sign    Days of Exercise per Week: 0 days    Minutes of Exercise per Session: 0 min  Stress: No Stress Concern Present (08/14/2023)   [employer] of Occupational Health - Occupational Stress Questionnaire    Feeling of Stress : Not at all  Social Connections: Socially Isolated (08/14/2023)   Social Connection and Isolation Panel [NHANES]    Frequency of Communication with Friends and Family: Twice a week    Frequency of Social Gatherings with Friends and Family: Twice a week    Attends Religious Services: Never    Database administrator or Organizations: No    Attends Banker Meetings: Never    Marital Status: Widowed    Tobacco Counseling Counseling given: Not Answered    Clinical Intake:  Pre-visit  preparation completed: Yes  Pain : No/denies pain     Nutritional Risks: None Diabetes: No  Lab Results  Component Value Date   HGBA1C 5.8 06/30/2022     How often do you need to have someone help you when you read instructions, pamphlets, or other written materials from your doctor or pharmacy?: 1 - Never  Interpreter Needed?: No  Information entered by :: NAllen LPN   Activities of Daily Living     08/14/2023    3:39 PM  In your present state of health, do you have any difficulty performing the following activities:  Hearing? 0  Vision? 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 0  Walking or climbing stairs? 1  Dressing or bathing? 0  Doing errands, shopping? 1  Comment someone goes with her  Preparing Food and eating ? N  Using the Toilet? N  In the past six months, have you accidently leaked urine? Y  Comment has a prolapse  Do you have problems with loss of bowel control? N  Managing your Medications? N  Managing your Finances? N  Housekeeping or managing your Housekeeping? N    Patient Care Team: Graig Lawyer, MD as PCP - General (Family Medicine)  Indicate any recent Medical Services you may have received from other than [facility] providers in the past year (date may be approximate).     Assessment:   This is a routine wellness examination for Jennifer [name].  Hearing/Vision screen Hearing Screening - Comments:: Denies hearing issues Vision Screening - Comments:: No regular eye exams   Goals Addressed             This Visit's Progress    Patient Stated       08/14/2023, denies goals       Depression Screen     08/14/2023    3:51 PM 10/27/2022    1:54 PM 08/11/2022    3:36 PM 06/30/2022   10:02 AM 12/28/2020   10:08 AM 04/05/2020    1:56 PM  PHQ 2/9 Scores  PHQ - 2 Score 1 0 0 4 2 0  PHQ- 9 Score 3   14 8      Fall Risk     08/14/2023    3:49 PM 10/27/2022    1:54 PM 08/11/2022    3:36 PM 06/30/2022    10:02 AM 12/28/2020   10:07 AM  Fall Risk   Falls in the past year? 0 0 0 0 0  Number falls in past yr: 0 0 0 0 0  Injury with Fall? 0 0 0 0 0  Risk for fall due to : Impaired vision;Medication side effect No Fall Risks Impaired vision;Medication side effect No Fall Risks No Fall Risks  Follow up Falls prevention discussed;Falls evaluation completed Falls evaluation completed Falls prevention discussed;Education provided;Falls evaluation completed Falls evaluation completed Falls evaluation completed    MEDICARE RISK AT HOME:  Medicare Risk at Home Any stairs in or around the home?: Yes If so, are there any without handrails?: No Home free of loose throw rugs in walkways, pet beds, electrical cords, etc?: Yes Adequate lighting in your home to reduce risk of falls?: Yes Life alert?: No Use of a cane, walker or w/c?: Yes Grab bars in the bathroom?: No Shower chair or bench in shower?: No Elevated toilet seat or a handicapped toilet?: Yes  TIMED UP AND GO:  Was the test performed?  No  Cognitive Function: 6CIT completed        08/14/2023    3:55 PM 08/11/2022    3:38 PM 12/28/2020   10:25 AM  6CIT Screen  What Year? 0 points 0 points 0 points  What month? 0 points 0 points 0 points  What time? 0 points 0 points 0 points  Count back from 20 0 points 0 points 0 points  Months in reverse 0 points 0 points 0 points  Repeat phrase 0 points 0 points 10 points  Total Score 0 points 0 points 10 points    Immunizations Immunization History  Administered Date(s) Administered   Pneumococcal Conjugate-13 07/05/2015   Pneumococcal Polysaccharide-23 07/24/2017    Screening Tests  Health Maintenance  Topic Date Due   DTaP/Tdap/Td (1 - Tdap) Never done   Zoster Vaccines- Shingrix (1 of 2) Never done   DEXA SCAN  Never done   COVID-19 Vaccine (1 - 2024-25 season) Never done   INFLUENZA VACCINE  11/20/2023   Medicare Annual Wellness (AWV)  08/13/2024   Pneumonia Vaccine 94+ Years old   Completed   HPV VACCINES  Aged Out   Meningococcal B Vaccine  Aged Out    Health Maintenance  Health Maintenance Due  Topic Date Due   DTaP/Tdap/Td (1 - Tdap) Never done   Zoster Vaccines- Shingrix (1 of 2) Never done   DEXA SCAN  Never done   COVID-19 Vaccine (1 - 2024-25 season) Never done   Health Maintenance Items Addressed: Declines vaccines. States had bone density in 2024, but does not remember name of place.  Additional Screening:  Vision Screening: Recommended annual ophthalmology exams for early detection of glaucoma and other disorders of the eye.  Dental Screening: Recommended annual dental exams for proper oral hygiene  Community Resource Referral / Chronic Care Management: CRR required this visit?  No   CCM required this visit?  No     Plan:     I have personally reviewed and noted the following in the patient's chart:   Medical and social history Use of alcohol, tobacco or illicit drugs  Current medications and supplements including opioid prescriptions. Patient is not currently taking opioid prescriptions. Functional ability and status Nutritional status Physical activity Advanced directives List of other physicians Hospitalizations, surgeries, and ER visits in previous 12 months Vitals Screenings to include cognitive, depression, and falls Referrals and appointments  In addition, I have reviewed and discussed with patient certain preventive protocols, quality metrics, and best practice recommendations. A written personalized care plan for preventive services as well as general preventive health recommendations were provided to patient.     Areatha Beecham, LPN   1/61/0960   After Visit Summary: (Pick Up) Due to this being a telephonic visit, with patients personalized plan was offered to patient and patient has requested to Pick up at office.  Notes: Nothing significant to report at this time.

## 2023-10-06 ENCOUNTER — Ambulatory Visit: Payer: Self-pay | Admitting: Family Medicine

## 2023-10-06 ENCOUNTER — Ambulatory Visit (INDEPENDENT_AMBULATORY_CARE_PROVIDER_SITE_OTHER): Admitting: Family Medicine

## 2023-10-06 ENCOUNTER — Encounter: Payer: Self-pay | Admitting: Family Medicine

## 2023-10-06 VITALS — BP 136/70 | HR 100 | Temp 97.6°F | Ht 63.0 in | Wt 121.6 lb

## 2023-10-06 DIAGNOSIS — K2100 Gastro-esophageal reflux disease with esophagitis, without bleeding: Secondary | ICD-10-CM

## 2023-10-06 DIAGNOSIS — I10 Essential (primary) hypertension: Secondary | ICD-10-CM

## 2023-10-06 DIAGNOSIS — G43709 Chronic migraine without aura, not intractable, without status migrainosus: Secondary | ICD-10-CM | POA: Diagnosis not present

## 2023-10-06 DIAGNOSIS — F419 Anxiety disorder, unspecified: Secondary | ICD-10-CM | POA: Diagnosis not present

## 2023-10-06 DIAGNOSIS — J4520 Mild intermittent asthma, uncomplicated: Secondary | ICD-10-CM | POA: Diagnosis not present

## 2023-10-06 DIAGNOSIS — K580 Irritable bowel syndrome with diarrhea: Secondary | ICD-10-CM | POA: Diagnosis not present

## 2023-10-06 DIAGNOSIS — M5431 Sciatica, right side: Secondary | ICD-10-CM

## 2023-10-06 DIAGNOSIS — R499 Unspecified voice and resonance disorder: Secondary | ICD-10-CM | POA: Diagnosis not present

## 2023-10-06 DIAGNOSIS — H401190 Primary open-angle glaucoma, unspecified eye, stage unspecified: Secondary | ICD-10-CM

## 2023-10-06 LAB — COMPREHENSIVE METABOLIC PANEL WITH GFR
ALT: 12 U/L (ref 0–35)
AST: 21 U/L (ref 0–37)
Albumin: 4.6 g/dL (ref 3.5–5.2)
Alkaline Phosphatase: 79 U/L (ref 39–117)
BUN: 16 mg/dL (ref 6–23)
CO2: 33 meq/L — ABNORMAL HIGH (ref 19–32)
Calcium: 9.6 mg/dL (ref 8.4–10.5)
Chloride: 103 meq/L (ref 96–112)
Creatinine, Ser: 0.8 mg/dL (ref 0.40–1.20)
GFR: 65.95 mL/min (ref >60.00)
Glucose, Bld: 131 mg/dL — ABNORMAL HIGH (ref 70–99)
Potassium: 3.7 meq/L (ref 3.5–5.1)
Sodium: 142 meq/L (ref 135–145)
Total Bilirubin: 0.5 mg/dL (ref 0.2–1.2)
Total Protein: 7 g/dL (ref 6.0–8.3)

## 2023-10-06 MED ORDER — LATANOPROST 0.005 % OP SOLN: 1.0000 [drp] | Freq: Every day | OPHTHALMIC | 2 refills | Status: AC

## 2023-10-06 MED ORDER — BUTALBITAL-APAP-CAFFEINE 50-325-40 MG PO TABS: 1.0000 | ORAL_TABLET | Freq: Every day | ORAL | 1 refills | Status: AC

## 2023-10-06 MED ORDER — DICYCLOMINE HCL 10 MG PO CAPS: 20.0000 mg | ORAL_CAPSULE | Freq: Two times a day (BID) | ORAL | 3 refills | Status: AC

## 2023-10-06 MED ORDER — PREDNISONE 20 MG PO TABS: 20.0000 mg | ORAL_TABLET | Freq: Every day | ORAL | 0 refills | Status: DC

## 2023-10-06 MED ORDER — ALBUTEROL SULFATE HFA 108 (90 BASE) MCG/ACT IN AERS
2.0000 | INHALATION_SPRAY | Freq: Four times a day (QID) | RESPIRATORY_TRACT | 3 refills | Status: AC | PRN
Start: 2023-10-06 — End: ?

## 2023-10-06 MED ORDER — PANTOPRAZOLE SODIUM 40 MG PO TBEC: 40.0000 mg | DELAYED_RELEASE_TABLET | Freq: Every day | ORAL | 3 refills | Status: AC

## 2023-10-06 MED ORDER — DIAZEPAM 5 MG PO TABS: 2.5000 mg | ORAL_TABLET | Freq: Every day | ORAL | 2 refills | Status: AC | PRN

## 2023-10-06 NOTE — Progress Notes (Signed)
 [facility] [address] [place] [address] Dept: [phone] Dept Fax: [phone]  Chronic Care Office Visit  Subjective:    Patient ID: Jennifer [name], female    DOB: May 16, 1933, 89 y.o..   MRN: [id number]  Chief Complaint  Patient presents with   Hypertension    F/u HTN and HA's.  C/o having Rt leg numbness/pain.   History of Present Illness:  Patient is in today for reassessment of chronic medical issues. She ahs not been seen in 11 months. Her son lives in [place], [place] and she does not have local transportation to bring her for appointments.  Jennifer [name] has a history of daily headaches, characterized as migraines. She has previously been receiving some limited amounts of Fioricet . She describes taking Tylenol  500 mg 6-8 times a day.    Jennifer [name] has a history of anxiety and depression. She has been managed long-term on Valium  typically taking 1/2 a tablet 3-4 times a week.    Jennifer [name] has essential hypertension. We had tried her on lisinopril  and then losartan , neither of which did she tolerate. We had been monitoring this off of meds.   Jennifer [name] notes ongoing issue with a cough over the past year. She has also noted her voice has become gravely. She sometimes coughs some mucous up. She has a history of nasal allergies. She also has had issues with GERD. She is prescribed pantoprazole  40 mg daily.  Jennifer [name] has recently had issues with intermittent pain radiating down her right buttocks  and the back of her leg. She has also experienced numbness in her right foot.  Past Medical History: Patient Active Problem List   Diagnosis Date Noted   Essential hypertension 06/30/2022   Eczema 06/30/2022   Venous insufficiency 06/30/2022   Nuclear sclerotic cataract of both eyes 02/18/2016   Regular astigmatism of both eyes 01/03/2016   Presbyopia of both eyes 01/03/2016   Myopia of both eyes 01/03/2016    Keratoconjunctivitis sicca of both eyes not specified as Sjogren's 01/03/2016   Exudative age-related macular degeneration (HCC) 07/08/2015   Idiopathic scoliosis of thoracolumbar spine 02/16/2015   Degenerative disc disease, lumbar 02/16/2015   Decreased peripheral vision of left eye 01/04/2015   Short-segment Barrett's esophagus 03/03/2014   Sciatica 03/03/2014   Primary osteoarthritis of right knee 03/03/2014   Primary open-angle glaucoma 03/03/2014   Other headache syndrome 03/03/2014   Ischemic optic neuropathy 03/03/2014   Irritable bowel syndrome 03/03/2014   Gastro-esophageal reflux disease with esophagitis 03/03/2014   Functional dyspepsia 03/03/2014   Depression 03/03/2014   Chronic nausea 03/03/2014   Chronic migraine without aura 03/03/2014   Allergic rhinitis 03/03/2014   Hyperlipidemia LDL goal <130 12/23/2013   Glaucoma 12/23/2013   Asthma 12/23/2013   Anxiety 12/23/2013   Past Surgical History:  Procedure Laterality Date   CHOLECYSTECTOMY     LEG SKIN LESION  BIOPSY / EXCISION     TONSILECTOMY, ADENOIDECTOMY, BILATERAL MYRINGOTOMY AND TUBES     TRABECULECTOMY Left    Family History  Problem Relation Age of Onset   Diabetes Mother    Stroke Mother    Alcohol abuse Father    Heart disease Father    Heart disease Sister    Diabetes Sister    Heart disease Brother    Diabetes Brother    Heart disease Brother    Diabetes Brother    Colon cancer Neg Hx    Esophageal cancer Neg Hx  Rectal cancer Neg Hx    Stomach cancer Neg Hx    Outpatient Medications Prior to Visit  Medication Sig Dispense Refill   acetaminophen  (TYLENOL ) 500 MG tablet Take 500 mg by mouth 2 (two) times daily as needed for moderate pain or headache.      triamcinolone  cream (KENALOG ) 0.1 % Apply 1 Application topically 2 (two) times daily. 30 g 0   albuterol  (VENTOLIN  HFA) 108 (90 Base) MCG/ACT inhaler Inhale 2 puffs into the lungs every 6 (six) hours as needed for wheezing or shortness  of breath. 8 g 3   butalbital -acetaminophen -caffeine  (FIORICET ) 50-325-40 MG tablet TAKE ONE TABLET BY MOUTH ONE TIME DAILY AS NEEDED FOR HEADACHE 30 tablet 1   diazepam  (VALIUM ) 5 MG tablet Take 0.5 tablets (2.5 mg total) by mouth daily as needed for anxiety. 15 tablet 2   dicyclomine  (BENTYL ) 10 MG capsule Take 2 capsules (20 mg total) by mouth 2 (two) times daily before a meal. 360 capsule 3   latanoprost (XALATAN) 0.005 % ophthalmic solution Place 1 drop into both eyes at bedtime.      pantoprazole  (PROTONIX ) 40 MG tablet Take 1 tablet (40 mg total) by mouth daily. Patient needs follow up appointment for future refills. Please call [phone] to schedule an appointment. 90 tablet 0   Facility-Administered Medications Prior to Visit  Medication Dose Route Frequency Provider Last Rate Last Admin   0.9 %  sodium chloride  infusion  500 mL Intravenous Once Pyrtle, Amber Bail, MD       Allergies  Allergen Reactions   Lisinopril  Diarrhea   Erythromycin Rash and Other (See Comments)    Blisters   Objective:   Today's Vitals   10/06/23 1249  BP: 136/70  Pulse: 100  Temp: 97.6 F (36.4 C)  TempSrc: Temporal  SpO2: 97%  Weight: 121 lb 9.6 oz (55.2 kg)  Height: 5' 3 (1.6 m)   Body mass index is 21.54 kg/m.   General: Well developed, well nourished. No acute distress. HEENT: Oropharynx clear. Lungs: Clear to auscultation bilaterally. No wheezing, rales or rhonchi. CV: RRR with a I/VI systolic murmur. Pulses 2+ bilaterally. Extremities: Full ROM. No joint swelling or tenderness. No edema noted. Psych: Alert and oriented. Normal mood and affect.  Health Maintenance Due  Topic Date Due   DTaP/Tdap/Td (1 - Tdap) Never done   Zoster Vaccines- Shingrix (1 of 2) Never done   DEXA SCAN  Never done     Assessment & Plan:   Problem List Items Addressed This Visit       Cardiovascular and Mediastinum   Chronic migraine without aura   I am very concerned about Jennifer [name] overusing  Tylenol . I discussed the concern for potential liver toxicity if she is taking the amounts she describes. She may be having an issue with rebound headache, as well. I have explained that Fioricet  is a controlled substance and has risk for habitual use. She argues that it is not controlled and is exempt. I recommended referral to neurology to consider other potential treatment options. She was adamant that she does not need these. She does not understand why I won't prescribe larger quantities of Fioricet , as she was given in the past.      Relevant Medications   butalbital -acetaminophen -caffeine  (FIORICET ) 50-325-40 MG tablet   predniSONE (DELTASONE) 20 MG tablet   Other Relevant Orders   Comprehensive metabolic panel with GFR (Completed)   Essential hypertension   Ms. Delio has been intolerant of several BP  meds. For now, her BP is adequately controlled. We will focus on non-pharmacologic approaches. I will check her renal function today.        Respiratory   Asthma   Stable. I will renew her albuterol . Her persistent cough could be due to her asthma. I will see if a steroid course helps this.      Relevant Medications   albuterol  (VENTOLIN  HFA) 108 (90 Base) MCG/ACT inhaler   predniSONE (DELTASONE) 20 MG tablet     Digestive   Gastro-esophageal reflux disease with esophagitis - Primary   I will renew her pantoprazole  40 mg daily.       Irritable bowel syndrome   I will renew her dicyclomine  10 mg TID as needed.      Relevant Medications   dicyclomine  (BENTYL ) 10 MG capsule   pantoprazole  (PROTONIX ) 40 MG tablet   Other Relevant Orders   Comprehensive metabolic panel with GFR (Completed)     Nervous and Auditory   Sciatica   Right leg symptoms are consistent with sciatica. I will try her on a course of prednisone to see if this will resolve.      Relevant Medications   diazepam  (VALIUM ) 5 MG tablet   predniSONE (DELTASONE) 20 MG tablet     Other   Anxiety   I will  continue limited amounts of Valium  for management. I have discussed concerns for habitual use of BZDs. Ms. Hedtke notes her sister is able to get 180 tabs at a time and doesn't understand why I am so cautious with this.      Relevant Medications   diazepam  (VALIUM ) 5 MG tablet   Primary open-angle glaucoma   I will renew her latanoprost, but strongly recommend she see an ophthalmologist who can assist in monitoring her IOP. She did not like her last ophthalmologist, so plans to find someone new.      Relevant Medications   pantoprazole  (PROTONIX ) 40 MG tablet   latanoprost (XALATAN) 0.005 % ophthalmic solution   Other Visit Diagnoses       Change in voice       The voice change/hoarseness could represent PND or acid reflux. I will see if steroids improves. If not, I will plan an ENT referral for naopharyngoscopy.   Relevant Medications   predniSONE (DELTASONE) 20 MG tablet       Return in about 6 months (around 04/06/2024) for Reassessment.   Graig Lawyer, MD

## 2023-10-06 NOTE — Assessment & Plan Note (Signed)
 I will renew her latanoprost, but strongly recommend she see an ophthalmologist who can assist in monitoring her IOP. She did not like her last ophthalmologist, so plans to find someone new.

## 2023-10-06 NOTE — Assessment & Plan Note (Addendum)
 Jennifer [name] has been intolerant of several BP meds. For now, her BP is adequately controlled. We will focus on non-pharmacologic approaches. I will check her renal function today.

## 2023-10-06 NOTE — Assessment & Plan Note (Signed)
 Right leg symptoms are consistent with sciatica. I will try her on a course of prednisone to see if this will resolve.

## 2023-10-06 NOTE — Assessment & Plan Note (Signed)
 I am very concerned about Jennifer [name] overusing Tylenol . I discussed the concern for potential liver toxicity if she is taking the amounts she describes. She may be having an issue with rebound headache, as well. I have explained that Fioricet  is a controlled substance and has risk for habitual use. She argues that it is not controlled and is exempt. I recommended referral to neurology to consider other potential treatment options. She was adamant that she does not need these. She does not understand why I won't prescribe larger quantities of Fioricet , as she was given in the past.

## 2023-10-06 NOTE — Assessment & Plan Note (Signed)
 I will renew her dicyclomine  10 mg TID as needed.

## 2023-10-06 NOTE — Assessment & Plan Note (Addendum)
 Stable. I will renew her albuterol . Her persistent cough could be due to her asthma. I will see if a steroid course helps this.

## 2023-10-06 NOTE — Assessment & Plan Note (Signed)
 I will continue limited amounts of Valium  for management. I have discussed concerns for habitual use of BZDs. Ms. Rispoli notes her sister is able to get 180 tabs at a time and doesn't understand why I am so cautious with this.

## 2023-10-06 NOTE — Assessment & Plan Note (Signed)
 I will renew her pantoprazole  40 mg daily.

## 2024-04-11 ENCOUNTER — Ambulatory Visit: Admitting: Family Medicine

## 2024-05-10 ENCOUNTER — Telehealth: Payer: Self-pay | Admitting: Internal Medicine

## 2024-05-10 NOTE — Telephone Encounter (Signed)
 Inbound call from patient stating that yesterday she experienced something really scary for her. Patient had 2 bites of her meal and her food did not go down. Patient stated that she also tired to drink something to help her food go down but she got choked up with the drink as well and ended up spitting it back up. Patient has requested for a call back today. Please advise.

## 2024-05-10 NOTE — Telephone Encounter (Signed)
 Pt having issues with dysphagia and constipation. Pt scheduled to see Dr .Albertus 05/11/24 at 10:10am. Pt aware of appt.

## 2024-05-11 ENCOUNTER — Encounter: Payer: Self-pay | Admitting: Internal Medicine

## 2024-05-11 ENCOUNTER — Ambulatory Visit: Admitting: Internal Medicine

## 2024-05-11 VITALS — BP 132/78 | HR 92 | Ht 65.0 in | Wt 132.0 lb

## 2024-05-11 DIAGNOSIS — M549 Dorsalgia, unspecified: Secondary | ICD-10-CM | POA: Diagnosis not present

## 2024-05-11 DIAGNOSIS — R1319 Other dysphagia: Secondary | ICD-10-CM

## 2024-05-11 DIAGNOSIS — R1310 Dysphagia, unspecified: Secondary | ICD-10-CM | POA: Diagnosis not present

## 2024-05-11 DIAGNOSIS — K219 Gastro-esophageal reflux disease without esophagitis: Secondary | ICD-10-CM

## 2024-05-11 DIAGNOSIS — R198 Other specified symptoms and signs involving the digestive system and abdomen: Secondary | ICD-10-CM

## 2024-05-11 DIAGNOSIS — K222 Esophageal obstruction: Secondary | ICD-10-CM | POA: Diagnosis not present

## 2024-05-11 DIAGNOSIS — K5730 Diverticulosis of large intestine without perforation or abscess without bleeding: Secondary | ICD-10-CM

## 2024-05-11 DIAGNOSIS — K5901 Slow transit constipation: Secondary | ICD-10-CM | POA: Diagnosis not present

## 2024-05-11 DIAGNOSIS — K5904 Chronic idiopathic constipation: Secondary | ICD-10-CM

## 2024-05-11 DIAGNOSIS — N8184 Pelvic muscle wasting: Secondary | ICD-10-CM

## 2024-05-11 NOTE — Patient Instructions (Addendum)
 We have referred you to Dr. Theressa office [facility] sports medicine). They have your referral and they said to contact them with an appointment. The phone number is [phone].  Start over the counter Senna S 2 tablets at bedtime and also can use glycerin suppositories in the morning as needed.   You have been scheduled for an endoscopy and flexible sigmoidoscopy. Please follow the written instructions given to you at your visit today.  If you use inhalers (even only as needed), please bring them with you on the day of your procedure.  DO NOT TAKE 7 DAYS PRIOR TO TEST- Trulicity (dulaglutide) Ozempic, Wegovy (semaglutide) Mounjaro, Zepbound (tirzepatide) Bydureon Bcise (exanatide extended release)  DO NOT TAKE 1 DAY PRIOR TO YOUR TEST Rybelsus (semaglutide) Adlyxin (lixisenatide) Victoza (liraglutide) Byetta (exanatide) _____________________________________________________________  _______________________________________________________  If your blood pressure at your visit was 140/90 or greater, please contact your primary care physician to follow up on this.  _______________________________________________________  If you are age 67 or older, your body mass index should be between 23-30. Your Body mass index is 21.97 kg/m. If this is out of the aforementioned range listed, please consider follow up with your Primary Care Provider.  If you are age 86 or younger, your body mass index should be between 19-25. Your Body mass index is 21.97 kg/m. If this is out of the aformentioned range listed, please consider follow up with your Primary Care Provider.   ________________________________________________________  The [facility] providers would like to encourage you to use MYCHART to communicate with providers for non-urgent requests or questions.  Due to long hold times on the telephone, sending your provider a message by [facility] may be a faster and more efficient way to get a  response.  Please allow 48 business hours for a response.  Please remember that this is for non-urgent requests.  _______________________________________________________  [facility] is using a team-based approach to care.  Your team is made up of your doctor and two to three APPS. Our APPS (Nurse Practitioners and Physician Assistants) work with your physician to ensure care continuity for you. They are fully qualified to address your health concerns and develop a treatment plan. They communicate directly with your gastroenterologist to care for you. Seeing the Advanced Practice Practitioners on your physician's team can help you by facilitating care more promptly, often allowing for earlier appointments, access to diagnostic testing, procedures, and other specialty referrals.

## 2024-05-11 NOTE — Progress Notes (Signed)
 "  Subjective:    Patient ID: Jennifer [name], female    DOB: [birth year], 89 y.o.   MRN: [id number]  HPI Jennifer [name] is an 89 year old female with chronic constipation, colonic diverticulosis, pelvic organ prolapse, and esophageal stricture who presents for follow-up of severe constipation.  She is here today with her son.  Constipation remains severe and refractory, characterized by lack of urge to defecate and frequent need for manual disimpaction. Stools are consistently hard, even with a liquid or fruit-heavy diet and avoidance of cheese. Typical intake is one to two small meals per day. Multiple laxatives have been trialed, including intermittent Miralax (perceived as ineffective), Senna (with more normal results), and a stool softener. Suppositories and enemas have not been used. Significant abdominal pain and cramping, especially below the umbilicus and mid-abdomen, often interfere with sleep. Dicyclomine  provides only partial relief, and Alka-Seltzer with aspirin in ginger ale is sometimes used for pain. No rectal bleeding reported. Nocturia is present, with urination nearly every hour at night, attributed to if she increases daytime fluid intake.  Defecatory dysfunction is prominent, with difficulty evacuating stool and lack of urge. Various over-the-counter and prescription medications have provided limited benefit. Pelvic organ prolapse has been identified on prior imaging.  Recent episodes of dysphagia have occurred, including food impaction following severe abdominal pain. On one occasion, two spoonfuls of food became stuck and required over two hours to clear, with inability to swallow liquids during the episode. Drinking resumed several hours later, but she remains fearful of eating. Esophageal stricture (Schatzki's ring) previously responded to dilation, last performed in August 2022. Pantoprazole  is continued for acid suppression. Heartburn is absent except during food impaction  episodes.  Family history is notable for two brothers with similar gastrointestinal problems, one requiring partial gastrectomy and the other partial colectomy for complete bowel obstruction, and a first cousin who required total intestinal removal for similar issues.  Chronic back pain due to arthritis and a recent injury while lifting firewood are present. Topical creams, including prescription formulations, have not provided relief. She is seeking referral for consideration of epidural spinal injection.   Review of Systems As per HPI, otherwise negative  Current Medications, Allergies, Past Medical History, Past Surgical History, Family History and Social History were reviewed in [employer] record.    Objective:   Physical Exam BP 132/78   Pulse 92   Ht 5' 5 (1.651 m)   Wt 132 lb (59.9 kg)   SpO2 99%   BMI 21.97 kg/m  Gen: awake, alert, NAD HEENT: anicteric  Abd: soft, NT/ND, +BS throughout Ext: no c/c/e Neuro: nonfocal  EGD 11/28/2020 Low-grade Schatzki's ring dilated to 15.5 with mild mucosal disruption and moderate improvement in luminal narrowing.  3 cm hiatal hernia.  Mild inflammation in the gastric antrum which was biopsied and reactive but no H. Pylori  CT scan abdomen pelvis 04/25/2022 for abdominal pain and constipation Scattered right sided colonic stool.  No bowel obstruction or free air.  Sigmoid diverticula.  Low-lying pelvic structures.  This includes the bladder, vagina and rectum.  Please correlate for prolapse.  Dynamic study can be performed as clinically indicated.  Elevated right hemidiaphragm.  Tiny small cystic foci along the liver, spleen and kidneys which are stable going back to 2020 and consistent with benign foci.      Assessment & Plan:   Chronic constipation with defecatory dysfunction/pelvic floor dysfunction Chronic, severe constipation with slow colonic transit, decreased hydration, colonic diverticulosis, and pelvic  floor weakness. Current regimen insufficient. - Senokot S two tablets nightly. - Glycerin suppositories PRN. - Held Miralax for now, can add back if needed. - Increased daytime fluid intake. - Continued dicyclomine  10-20 mg every 8 hours PRN. - Education on glycerin suppositories. - Planned flexible sigmoidoscopy.  The nature of the procedure, as well as the risks, benefits, and alternatives were carefully and thoroughly reviewed with the patient. Ample time for discussion and questions allowed. The patient understood, was satisfied, and agreed to proceed.    Colonic diverticulosis Diverticulosis in sigmoid colon affecting stool passage, no diverticulitis or acute inflammation. - Planned flexible sigmoidoscopy. - Continued current constipation management.  Pelvic organ prolapse Chronic prolapse with decreased pelvic floor tone affecting defecation and nocturia.  Seen by CT scan 2 years ago - Planned flexible sigmoidoscopy. - Increased daytime fluid intake, limit evening fluids.  Esophageal stricture with dysphagia Recurrent esophageal stricture with dysphagia, previously responsive to dilation. - Planned upper endoscopy with dilation. - Continued pantoprazole  40 mg daily..  Gastroesophageal reflux disease GERD well controlled on pantoprazole , no heartburn except during food impaction. - Continued pantoprazole  40 mg daily.  Back pain Patient request epidural spinal injection. - Referral to Dr. Claudene with sports medicine  40 minutes total spent today including patient facing time, coordination of care, reviewing medical history/procedures/pertinent radiology studies, and documentation of the encounter.  "

## 2024-05-13 ENCOUNTER — Encounter: Payer: Self-pay | Admitting: Internal Medicine

## 2024-05-13 ENCOUNTER — Ambulatory Visit: Admitting: Internal Medicine

## 2024-05-13 VITALS — BP 138/69 | HR 84 | Temp 97.2°F | Resp 18 | Ht 65.0 in | Wt 132.0 lb

## 2024-05-13 DIAGNOSIS — K449 Diaphragmatic hernia without obstruction or gangrene: Secondary | ICD-10-CM

## 2024-05-13 DIAGNOSIS — K623 Rectal prolapse: Secondary | ICD-10-CM

## 2024-05-13 DIAGNOSIS — K5730 Diverticulosis of large intestine without perforation or abscess without bleeding: Secondary | ICD-10-CM | POA: Diagnosis not present

## 2024-05-13 DIAGNOSIS — K5904 Chronic idiopathic constipation: Secondary | ICD-10-CM

## 2024-05-13 DIAGNOSIS — R1319 Other dysphagia: Secondary | ICD-10-CM

## 2024-05-13 DIAGNOSIS — R198 Other specified symptoms and signs involving the digestive system and abdomen: Secondary | ICD-10-CM

## 2024-05-13 DIAGNOSIS — K222 Esophageal obstruction: Secondary | ICD-10-CM | POA: Diagnosis not present

## 2024-05-13 DIAGNOSIS — R1030 Lower abdominal pain, unspecified: Secondary | ICD-10-CM

## 2024-05-13 DIAGNOSIS — D125 Benign neoplasm of sigmoid colon: Secondary | ICD-10-CM | POA: Diagnosis not present

## 2024-05-13 MED ORDER — SODIUM CHLORIDE 0.9 % IV SOLN: 500.0000 mL | Freq: Once | INTRAVENOUS | Status: DC

## 2024-05-13 NOTE — Op Note (Signed)
  [facility] Patient Name: Jennifer [name] Procedure Date: 05/13/2024 1:59 PM MRN: [id number] Endoscopist: Gordy CHRISTELLA Starch , MD, [id number] Age: 89 Referring MD:  Date of Birth: Aug 21, 1933 Gender: Female Account #: [id number] Procedure:                Flexible Sigmoidoscopy Indications:              Lower abdominal pain, Constipation despite previous                            laxative regimen Medicines:                Monitored Anesthesia Care Procedure:                Pre-Anesthesia Assessment:                           - Prior to the procedure, a History and Physical                            was performed, and patient medications and                            allergies were reviewed. The patient's tolerance of                            previous anesthesia was also reviewed. The risks                            and benefits of the procedure and the sedation                            options and risks were discussed with the patient.                            All questions were answered, and informed consent                            was obtained. Prior Anticoagulants: The patient has                            taken no anticoagulant or antiplatelet agents. ASA                            Grade Assessment: III - A patient with severe                            systemic disease. After reviewing the risks and                            benefits, the patient was deemed in satisfactory                            condition to undergo the procedure.  After obtaining informed consent, the scope was                            passed under direct vision. The Olympus Scope                            [id number] was introduced through the anus and                            advanced to the descending colon. The flexible                            sigmoidoscopy was accomplished without difficulty.                            The patient tolerated the procedure  well. The                            quality of the bowel preparation was excellent. Scope In: 3:14:53 PM Scope Out: 3:22:38 PM Total Procedure Duration: 0 hours 7 minutes 45 seconds  Findings:                 The digital rectal exam was normal.                           Multiple medium-mouthed and small-mouthed                            diverticula were found in the sigmoid colon.                           A 6 mm polyp was found in the sigmoid colon. The                            polyp was sessile. The polyp was removed with a                            cold snare. Resection and retrieval were complete.                           Edematous distal rectal mucosa consistent with                            prolapse change. No additional abnormalities were                            found on retroflexion.                           The exam was otherwise without abnormality. Complications:            No immediate complications. Estimated Blood Loss:     Estimated blood loss: none. Impression:               - Moderate diverticulosis in the sigmoid colon.                           -  One 6 mm polyp in the sigmoid colon, removed with                            a cold snare. Resected and retrieved.                           - Distal rectal mucosal prolapse edema. Retroflexed                            views otherwise normal.                           - The examination was otherwise normal. Recommendation:           - Patient has a contact number available for                            emergencies. The signs and symptoms of potential                            delayed complications were discussed with the                            patient. Return to normal activities tomorrow.                            Written discharge instructions were provided to the                            patient.                           - Advance diet as tolerated.                           - Await pathology  results.                           - Proceed with plan for Senakot-S 2 tablets at                            bedtime nightly. Glycerin suppository can be used                            in the morning if needed to aid bowel movements.                           - If abdominal pain fails to improve with laxative                            regimen please contact my office. Repeat CT scan of                            the abd/pelvis would be next recommended step. Gordy CHRISTELLA Starch, MD 05/13/2024 3:43:37 PM This report  has been signed electronically.

## 2024-05-13 NOTE — Progress Notes (Signed)
 Patient arrives for EGD and Flex Sig, called [facility] this am, took part one of prep last night, started vomiting at 0800 today, told not to attempt part 2 of prep and to come in for an enema. Attempted fleets enema #1, patient had difficulty hold in fluid at beginning and unable to tell how much fluid she retained, return of small amount of clear fluid with some sediment, attempted fleets enema #2, similar result, MD notified.

## 2024-05-13 NOTE — Progress Notes (Signed)
 See office note dated 05/11/2024 for details and current H&P  Patient presenting for upper endoscopy to evaluate dysphagia in the setting of GERD and prior esophageal stricture.  Also chronic constipation, pelvic organ prolapse and trouble defecating.  Patient remains appropriate for upper endoscopy and flexible sigmoidoscopy in the [facility].

## 2024-05-13 NOTE — Progress Notes (Signed)
 Called to room to assist during endoscopic procedure.  Patient ID and intended procedure confirmed with present staff. Received instructions for my participation in the procedure from the performing physician.

## 2024-05-13 NOTE — Op Note (Signed)
 [facility] Patient Name: Jennifer [name] Procedure Date: 05/13/2024 1:59 PM MRN: [id number] Endoscopist: Gordy CHRISTELLA Starch , MD, [id number] Age: 89 Referring MD:  Date of Birth: 04/08/1934 Gender: Female Account #: [id number] Procedure:                Upper GI endoscopy Indications:              Dysphagia; known esophageal stricture last dilation                            11/2020 to 15.5 mm Medicines:                Monitored Anesthesia Care Procedure:                Pre-Anesthesia Assessment:                           - Prior to the procedure, a History and Physical                            was performed, and patient medications and                            allergies were reviewed. The patient's tolerance of                            previous anesthesia was also reviewed. The risks                            and benefits of the procedure and the sedation                            options and risks were discussed with the patient.                            All questions were answered, and informed consent                            was obtained. Prior Anticoagulants: The patient has                            taken no anticoagulant or antiplatelet agents. ASA                            Grade Assessment: III - A patient with severe                            systemic disease. After reviewing the risks and                            benefits, the patient was deemed in satisfactory                            condition to undergo the procedure.  After obtaining informed consent, the endoscope was                            passed under direct vision. Throughout the                            procedure, the patient's blood pressure, pulse, and                            oxygen saturations were monitored continuously. The                            Olympus Scope [id number] was introduced through the                            mouth, and advanced to the  second part of duodenum.                            The upper GI endoscopy was accomplished without                            difficulty. The patient tolerated the procedure                            well. Scope In: Scope Out: Findings:                 One benign-appearing, intrinsic moderate                            (circumferential scarring or stenosis; an endoscope                            may pass) stenosis was found 35 cm from the                            incisors. This stenosis measured 1.1 cm (inner                            diameter) x less than one cm (in length). The                            stenosis was traversed. A TTS dilator was passed                            through the scope. Dilation with a 15-16.5-18 mm                            balloon dilator was performed to 16.5 mm. The                            dilation site was examined and showed moderate  mucosal disruption.                           A 4 cm hiatal hernia was present.                           The entire examined stomach was normal.                           The examined duodenum was normal. Complications:            No immediate complications. Estimated Blood Loss:     Estimated blood loss was minimal. Impression:               - Benign-appearing esophageal stenosis. Dilated to                            16.5 mm with balloon.                           - 4 cm hiatal hernia.                           - Normal stomach.                           - Normal examined duodenum.                           - No specimens collected. Recommendation:           - Patient has a contact number available for                            emergencies. The signs and symptoms of potential                            delayed complications were discussed with the                            patient. Return to normal activities tomorrow.                            Written discharge instructions  were provided to the                            patient.                           - Resume previous diet.                           - Continue present medications. Gordy CHRISTELLA Starch, MD 05/13/2024 3:36:30 PM This report has been signed electronically.

## 2024-05-13 NOTE — Patient Instructions (Addendum)
 Resume previous diet.  Continue present medications.   Advance diet as tolerated.   Proceed with plan for Senakot-S 2 tablets at bedtime nightly. Glycerin suppository can be used in the morning if needed to aid bowel movements.   If abdominal pain fails to improve with laxative regimen please contact my office. Repeat CT scan of the abdomen/pelvis would be next recommended step.   YOU HAD AN ENDOSCOPIC PROCEDURE TODAY AT THE [facility]:   Refer to the procedure report that was given to you for any specific questions about what was found during the examination.  If the procedure report does not answer your questions, please call your gastroenterologist to clarify.  If you requested that your care partner not be given the details of your procedure findings, then the procedure report has been included in a sealed envelope for you to review at your convenience later.  YOU SHOULD EXPECT: Some feelings of bloating in the abdomen. Passage of more gas than usual.  Walking can help get rid of the air that was put into your GI tract during the procedure and reduce the bloating. If you had a lower endoscopy (such as a colonoscopy or flexible sigmoidoscopy) you may notice spotting of blood in your stool or on the toilet paper. If you underwent a bowel prep for your procedure, you may not have a normal bowel movement for a few days.  Please Note:  You might notice some irritation and congestion in your nose or some drainage.  This is from the oxygen used during your procedure.  There is no need for concern and it should clear up in a day or so.  SYMPTOMS TO REPORT IMMEDIATELY:  Following lower endoscopy (colonoscopy or flexible sigmoidoscopy):  Excessive amounts of blood in the stool  Significant tenderness or worsening of abdominal pains  Swelling of the abdomen that is new, acute  Fever of 100F or higher  Following upper endoscopy (EGD)  Vomiting of blood or coffee ground material  New  chest pain or pain under the shoulder blades  Painful or persistently difficult swallowing  New shortness of breath  Fever of 100F or higher  Black, tarry-looking stools  For urgent or emergent issues, a gastroenterologist can be reached at any hour by calling [phone]. Do not use MyChart messaging for urgent concerns.    DIET:  We do recommend a small meal at first, but then you may proceed to your regular diet.  Drink plenty of fluids but you should avoid alcoholic beverages for 24 hours.  ACTIVITY:  You should plan to take it easy for the rest of today and you should NOT DRIVE or use heavy machinery until tomorrow (because of the sedation medicines used during the test).    FOLLOW UP: Our staff will call the number listed on your records the next business day following your procedure.  We will call around 7:15- 8:00 am to check on you and address any questions or concerns that you may have regarding the information given to you following your procedure. If we do not reach you, we will leave a message.     If any biopsies were taken you will be contacted by phone or by letter within the next 1-3 weeks.  Please call us  at [phone] if you have not heard about the biopsies in 3 weeks.    SIGNATURES/CONFIDENTIALITY: You and/or your care partner have signed paperwork which will be entered into your electronic medical record.  These signatures  attest to the fact that that the information above on your After Visit Summary has been reviewed and is understood.  Full responsibility of the confidentiality of this discharge information lies with you and/or your care-partner.

## 2024-05-13 NOTE — Progress Notes (Signed)
 Report to PACU, RN, vss, BBS= Clear.

## 2024-05-17 ENCOUNTER — Telehealth: Payer: Self-pay | Admitting: *Deleted

## 2024-05-17 NOTE — Telephone Encounter (Signed)
" °  Follow up Call-     05/13/2024    2:15 PM  Call back number  Post procedure Call Back phone  # [phone]  Permission to leave phone message Yes   Left message on machine to call back if any questions or concerns "

## 2024-05-18 ENCOUNTER — Ambulatory Visit: Payer: Self-pay | Admitting: Internal Medicine

## 2024-05-18 LAB — SURGICAL PATHOLOGY

## 2024-05-27 ENCOUNTER — Emergency Department (HOSPITAL_COMMUNITY)

## 2024-05-27 ENCOUNTER — Encounter (HOSPITAL_COMMUNITY): Payer: Self-pay | Admitting: Internal Medicine

## 2024-05-27 ENCOUNTER — Other Ambulatory Visit: Payer: Self-pay

## 2024-05-27 ENCOUNTER — Observation Stay (HOSPITAL_COMMUNITY)
Admission: EM | Admit: 2024-05-27 | Source: Home / Self Care | Attending: Emergency Medicine | Admitting: Emergency Medicine

## 2024-05-27 DIAGNOSIS — H409 Unspecified glaucoma: Secondary | ICD-10-CM | POA: Diagnosis present

## 2024-05-27 DIAGNOSIS — J45909 Unspecified asthma, uncomplicated: Secondary | ICD-10-CM | POA: Diagnosis present

## 2024-05-27 DIAGNOSIS — R55 Syncope and collapse: Principal | ICD-10-CM | POA: Diagnosis present

## 2024-05-27 DIAGNOSIS — F419 Anxiety disorder, unspecified: Secondary | ICD-10-CM | POA: Diagnosis present

## 2024-05-27 DIAGNOSIS — K2100 Gastro-esophageal reflux disease with esophagitis, without bleeding: Secondary | ICD-10-CM | POA: Diagnosis present

## 2024-05-27 DIAGNOSIS — R269 Unspecified abnormalities of gait and mobility: Secondary | ICD-10-CM

## 2024-05-27 DIAGNOSIS — W19XXXA Unspecified fall, initial encounter: Secondary | ICD-10-CM | POA: Diagnosis present

## 2024-05-27 DIAGNOSIS — E785 Hyperlipidemia, unspecified: Secondary | ICD-10-CM | POA: Diagnosis present

## 2024-05-27 DIAGNOSIS — I10 Essential (primary) hypertension: Secondary | ICD-10-CM | POA: Diagnosis present

## 2024-05-27 DIAGNOSIS — F32A Depression, unspecified: Secondary | ICD-10-CM | POA: Diagnosis present

## 2024-05-27 LAB — TROPONIN T, HIGH SENSITIVITY
Troponin T High Sensitivity: 22 ng/L — ABNORMAL HIGH (ref 0–19)
Troponin T High Sensitivity: 23 ng/L — ABNORMAL HIGH (ref 0–19)

## 2024-05-27 LAB — COMPREHENSIVE METABOLIC PANEL WITH GFR
ALT: 31 U/L (ref 0–44)
AST: 57 U/L — ABNORMAL HIGH (ref 15–41)
Albumin: 4.3 g/dL (ref 3.5–5.0)
Alkaline Phosphatase: 123 U/L (ref 38–126)
Anion gap: 8 (ref 5–15)
BUN: 8 mg/dL (ref 8–23)
CO2: 30 mmol/L (ref 22–32)
Calcium: 9.9 mg/dL (ref 8.9–10.3)
Chloride: 102 mmol/L (ref 98–111)
Creatinine, Ser: 0.7 mg/dL (ref 0.44–1.00)
GFR, Estimated: >60 mL/min
Glucose, Bld: 102 mg/dL — ABNORMAL HIGH (ref 70–99)
Potassium: 5.5 mmol/L — ABNORMAL HIGH (ref 3.5–5.1)
Sodium: 140 mmol/L (ref 135–145)
Total Bilirubin: 0.5 mg/dL (ref 0.0–1.2)
Total Protein: 7.1 g/dL (ref 6.5–8.1)

## 2024-05-27 LAB — CBC
HCT: 45.8 % (ref 36.0–46.0)
Hemoglobin: 14.3 g/dL (ref 12.0–15.0)
MCH: 30 pg (ref 26.0–34.0)
MCHC: 31.2 g/dL (ref 30.0–36.0)
MCV: 96 fL (ref 80.0–100.0)
Platelets: 261 10*3/uL (ref 150–400)
RBC: 4.77 MIL/uL (ref 3.87–5.11)
RDW: 14 % (ref 11.5–15.5)
WBC: 5.2 10*3/uL (ref 4.0–10.5)
nRBC: 0 % (ref 0.0–0.2)

## 2024-05-27 LAB — VITAMIN B12: Vitamin B-12: 449 pg/mL (ref 180–914)

## 2024-05-27 LAB — TSH: TSH: 0.621 u[IU]/mL (ref 0.350–4.500)

## 2024-05-27 LAB — CK: Total CK: 148 U/L (ref 38–234)

## 2024-05-27 MED ORDER — ACETAMINOPHEN 650 MG RE SUPP: 650.0000 mg | Freq: Four times a day (QID) | RECTAL | Status: AC | PRN

## 2024-05-27 MED ORDER — SODIUM CHLORIDE 0.9% FLUSH
3.0000 mL | Freq: Two times a day (BID) | INTRAVENOUS | Status: AC
  Administered 2024-05-27: 3 mL via INTRAVENOUS

## 2024-05-27 MED ORDER — SODIUM ZIRCONIUM CYCLOSILICATE 5 G PO PACK
5.0000 g | PACK | Freq: Once | ORAL | Status: AC
  Administered 2024-05-27: 5 g via ORAL
  Filled 2024-05-27: qty 1

## 2024-05-27 MED ORDER — LATANOPROST 0.005 % OP SOLN
1.0000 [drp] | Freq: Every day | OPHTHALMIC | Status: AC
  Administered 2024-05-27: 1 [drp] via OPHTHALMIC
  Filled 2024-05-27: qty 2.5

## 2024-05-27 MED ORDER — SODIUM CHLORIDE 0.9 % IV BOLUS
1000.0000 mL | Freq: Once | INTRAVENOUS | Status: AC
  Administered 2024-05-27: 1000 mL via INTRAVENOUS

## 2024-05-27 MED ORDER — ENOXAPARIN SODIUM 40 MG/0.4ML IJ SOSY
40.0000 mg | PREFILLED_SYRINGE | Freq: Every day | INTRAMUSCULAR | Status: AC
  Administered 2024-05-27: 40 mg via SUBCUTANEOUS
  Filled 2024-05-27: qty 0.4

## 2024-05-27 MED ORDER — ALBUTEROL SULFATE HFA 108 (90 BASE) MCG/ACT IN AERS: 2.0000 | INHALATION_SPRAY | Freq: Four times a day (QID) | RESPIRATORY_TRACT | Status: DC | PRN

## 2024-05-27 MED ORDER — ENOXAPARIN SODIUM 40 MG/0.4ML IJ SOSY: 40.0000 mg | PREFILLED_SYRINGE | INTRAMUSCULAR | Status: DC

## 2024-05-27 MED ORDER — ACETAMINOPHEN 325 MG PO TABS
650.0000 mg | ORAL_TABLET | Freq: Four times a day (QID) | ORAL | Status: AC | PRN
  Administered 2024-05-27: 650 mg via ORAL
  Filled 2024-05-27: qty 2

## 2024-05-27 MED ORDER — ONDANSETRON HCL 4 MG PO TABS: 4.0000 mg | ORAL_TABLET | Freq: Four times a day (QID) | ORAL | Status: AC | PRN

## 2024-05-27 MED ORDER — ONDANSETRON HCL 4 MG/2ML IJ SOLN: 4.0000 mg | Freq: Four times a day (QID) | INTRAMUSCULAR | Status: AC | PRN

## 2024-05-27 MED ORDER — ALBUTEROL SULFATE (2.5 MG/3ML) 0.083% IN NEBU: 2.5000 mg | INHALATION_SOLUTION | Freq: Four times a day (QID) | RESPIRATORY_TRACT | Status: AC | PRN

## 2024-05-27 NOTE — H&P (Signed)
 " History and Physical    Patient: Jennifer [name] FMW:[id number] DOB: [birth year] DOA: 05/27/2024 DOS: the patient was seen and examined on 05/27/2024 PCP: Patient, No Pcp Per  Patient coming from: Home  Chief Complaint:  Chief Complaint  Patient presents with   Fall   Dizziness   HPI: Jennifer [name] is a 89 y.o. female with medical history significant of anxiety, depression, osteoarthritis, asthma, skin cancer, cataracts, cholecystitis/cholecystectomy, chronic migraine with alcohol recurrent, L5-S1 DDD, GERD, glaucoma, hyperlipidemia, unspecified neuromuscular disorder, osteoarthritis, osteopenia who presented to the emergency department with complaints of falling at home and being on the floor for a couple days this past weekend.  She apparently lost consciousness and fell down Friday evening around 1700 and was on the floor until her son came on Tuesday.  She does not know how long she had LOC.   No chest pain, palpitations, diaphoresis, PND, orthopnea or recent pitting edema of the lower extremities. She denied fever, chills, rhinorrhea, sore throat, wheezing or hemoptysis.  No abdominal pain, nausea, emesis, diarrhea, constipation, melena or hematochezia.  No flank pain, dysuria, frequency or hematuria.  No polyuria, polydipsia, polyphagia or blurred vision.  The patient is a vegetarian.  Lab work: CBC and total CK were normal.  Troponin was 22 and 23 ng/L.  CMP showed an AST of 57 units/L, potassium 5.5 mmol/L and glucose of 102 mg/dL, the rest of the CMP measurements were normal.  Imaging: Hip x-ray showed no acute osseous abnormalities.  Right knee x-ray showing degenerative changes but no acute findings.  Left knee x-ray with no acute fracture.  However, showing lucent focus along the medial aspect of the left knee abutting the articular surface, with thin sclerotic margin and possible internal septations, concerning for a neoplastic process.  Nonemergent MRI of the knee recommended.  CT head  without contrast with no acute intracranial abnormality.  Moderate chronic microvascular ischemic changes and mild cerebral volume loss for age.  Remote lacunar infarcts in the right basal ganglia.   ED course: Initial vital signs were temperature 98.2 F, pulse 90, respiration 22, BP 160/85 mmHg and O2 sat 100% in on room air.  The patient received 1000 mL of normal saline bolus and 5 g of Lokelma .  Review of Systems: As mentioned in the history of present illness. All other systems reviewed and are negative. Past Medical History:  Diagnosis Date   Anxiety    Arthritis    Asthma    Cancer (HCC)    Cataract    Cholecystitis    Chronic migraine without aura    Degenerative disc disease at L5-S1 level    Depression    GERD (gastroesophageal reflux disease)    Glaucoma    Hiatal hernia    Hyperlipidemia    Irritable bowel    Neuromuscular disorder (HCC)    Osteoarthritis    Osteopenia    Scoliosis    Past Surgical History:  Procedure Laterality Date   CHOLECYSTECTOMY     LEG SKIN LESION  BIOPSY / EXCISION     TONSILECTOMY, ADENOIDECTOMY, BILATERAL MYRINGOTOMY AND TUBES     TRABECULECTOMY Left    Social History:  reports that she has never smoked. She has never used smokeless tobacco. She reports that she does not drink alcohol and does not use drugs.  Allergies[1]  Family History  Problem Relation Age of Onset   Diabetes Mother    Stroke Mother    Alcohol abuse Father    Heart disease Father  Heart disease Sister    Diabetes Sister    Heart disease Brother    Diabetes Brother    Heart disease Brother    Diabetes Brother    Colon cancer Neg Hx    Esophageal cancer Neg Hx    Rectal cancer Neg Hx    Stomach cancer Neg Hx     Prior to Admission medications  Medication Sig Start Date End Date Taking? Authorizing Provider  acetaminophen  (TYLENOL ) 500 MG tablet Take 500 mg by mouth 2 (two) times daily as needed for moderate pain or headache.     [provider]  albuterol  (VENTOLIN  HFA) 108 (90 Base) MCG/ACT inhaler Inhale 2 puffs into the lungs every 6 (six) hours as needed for wheezing or shortness of breath. 10/06/23   Thedora Garnette HERO, MD  butalbital -acetaminophen -caffeine  (FIORICET ) 50-325-40 MG tablet Take 1 tablet by mouth daily. 10/06/23   Thedora Garnette HERO, MD  diazepam  (VALIUM ) 5 MG tablet Take 0.5 tablets (2.5 mg total) by mouth daily as needed for anxiety. 10/06/23   Thedora Garnette HERO, MD  diclofenac Sodium (VOLTAREN) 1 % GEL Apply 4 g topically 3 (three) times daily as needed. 01/16/24   [provider]  dicyclomine  (BENTYL ) 10 MG capsule Take 2 capsules (20 mg total) by mouth 2 (two) times daily before a meal. 10/06/23   Thedora Garnette HERO, MD  latanoprost  (XALATAN ) 0.005 % ophthalmic solution Place 1 drop into both eyes at bedtime. 10/06/23   Thedora Garnette HERO, MD  ondansetron  (ZOFRAN -ODT) 4 MG disintegrating tablet Take 4 mg by mouth 2 (two) times daily as needed. 02/24/24   [provider]  pantoprazole  (PROTONIX ) 40 MG tablet Take 1 tablet (40 mg total) by mouth daily. Patient needs follow up appointment for future refills. Please call [phone] to schedule an appointment. 10/06/23   Thedora Garnette HERO, MD  triamcinolone  cream (KENALOG ) 0.1 % Apply 1 Application topically 2 (two) times daily. Patient taking differently: Apply 1 Application topically as needed. 06/30/22   Thedora Garnette HERO, MD    Physical Exam: Vitals:   05/27/24 1354  BP: (!) 160/85  Pulse: 90  Resp: (!) 22  Temp: 98.2 F (36.8 C)  TempSrc: Oral  SpO2: 100%   Physical Exam Vitals and nursing note reviewed.  Constitutional:      Appearance: Normal appearance. She is ill-appearing.  HENT:     Head: Normocephalic.     Nose: No rhinorrhea.     Mouth/Throat:     Mouth: Mucous membranes are moist.  Eyes:     General: No scleral icterus.    Pupils: Pupils are equal, round, and reactive to light.  Cardiovascular:     Rate and Rhythm: Normal rate and regular  rhythm.  Pulmonary:     Effort: Pulmonary effort is normal.     Breath sounds: Normal breath sounds. No wheezing, rhonchi or rales.  Abdominal:     General: Bowel sounds are normal. There is no distension.     Palpations: Abdomen is soft.     Tenderness: There is no abdominal tenderness. There is no right CVA tenderness or left CVA tenderness.  Musculoskeletal:     Cervical back: Neck supple.     Right lower leg: No edema.     Left lower leg: No edema.  Skin:    General: Skin is warm and dry.  Neurological:     General: No focal deficit present.     Mental Status: She is alert and oriented to  person, place, and time.  Psychiatric:        Mood and Affect: Mood normal.        Behavior: Behavior normal.     Data Reviewed:  Results are pending, will review when available.  EKG: Vent. rate 87 BPM PR interval 138 ms QRS duration 85 ms QT/QTcB 354/426 ms P-R-T axes 78 13 58 Sinus rhythm Atrial premature complex  Assessment and Plan: Principal Problem:   Fall Secondary to:   Abnormal gait Leading to:   Syncope  Observation/telemetry. Continue IV fluids. Check TSH level.   Check B12 level. (The patient is vegetarian). Correct electrolyte abnormality. Check carotid Doppler. Check echocardiogram.  Active Problems:   Hyperlipidemia LDL goal <130 Currently not on therapy. Follow-up with PCP.    Glaucoma Continue latanoprost  1 drop to both eyes nightly.    Gastro-esophageal reflux disease with esophagitis Antiacid, H2 blocker or PPI as needed. -Continue home PPI if still taking. --Pharmacy med rec still pending.    Depression   Anxiety May use as needed diazepam .    Asthma Bronchodilators as needed.    Essential hypertension Currently not on antihypertensives. Per PCP, she has been intolerant to antihypertensives. Continue blood pressure monitoring.     Advance Care Planning:   Code Status: Full Code   Consults:   Family Communication:   Severity  of Illness: The appropriate patient status for this patient is OBSERVATION. Observation status is judged to be reasonable and necessary in order to provide the required intensity of service to ensure the patient's safety. The patient's presenting symptoms, physical exam findings, and initial radiographic and laboratory data in the context of their medical condition is felt to place them at decreased risk for further clinical deterioration. Furthermore, it is anticipated that the patient will be medically stable for discharge from the hospital within 2 midnights of admission.   Author: Alm Dorn Castor, MD 05/27/2024 4:30 PM  For on call review [email or web address].   This document was prepared using Dragon voice recognition software and may contain some unintended transcription errors.     [1]  Allergies Allergen Reactions   Brimonidine Other (See Comments)    Other reaction(s): Other (See Comments), Other reaction(s): Dizziness (intolerance), Other reaction(s): Headache, Nausea, sore throat, dizziness, cough    Other Reaction(s): Dizziness (intolerance), Headache    Other reaction(s): Other (See Comments) Other reaction(s): Dizziness (intolerance) Other reaction(s): Headache Nausea, sore throat, dizziness, cough    Other reaction(s): Dizziness (intolerance)    Nausea, sore throat, dizziness, cough   Dorzolamide Other (See Comments)    Other reaction(s): Other (See Comments), Headaches, Other reaction(s): Headache, Nausea, dizziness, sore throat, cough, Headaches    Other Reaction(s): Headache    Other reaction(s): Other (See Comments) Headaches Other reaction(s): Headache Nausea, dizziness, sore throat, cough Headaches    Headaches    Nausea, dizziness, sore throat, cough   Lisinopril  Diarrhea   Erythromycin Rash and Other (See Comments)    Blisters   "

## 2024-05-27 NOTE — ED Notes (Signed)
 Pt to xray

## 2024-05-27 NOTE — ED Triage Notes (Signed)
 Pt reports she had a fall last Thursday and was on the floor for several days because of the snow, pt son went to check on her and found her on the ground. Pt denies hitting head and denies blood thinners, pt her today related to pain after fall, pt c/o pain in right knee and hip, pt reports fall was because of dizziness.

## 2024-05-27 NOTE — ED Provider Notes (Signed)
 " [place] [facility]MENT AT [facility] Provider Note  CSN: [id number] Arrival date & time: 05/27/24 1332  Chief Complaint(s) Fall and Dizziness  HPI Jennifer [name] is a 89 y.o. female who is here today due to generalized weakness.  Son is at bedside helps provide history.  Patient lives independently.  Reportedly, 1 week ago, at 5 PM, patient stood up and lost consciousness.  She fell down to the ground.  She is unsure if she struck her head.  She says that she was unable to lift herself up, and remained on the ground until her son came to check on her on Tuesday.  Son reports that he was unable to get in touch with her, he lives [place] near [address], and drove out when he was not able to reach her.  He reports that he found the patient on the ground.  He was able to assist her up.  Patient has felt weak since then, he has been trying to encourage her to come to the ED for several days and today she finally was amenable.  Patient reports that she feels lightheaded when she stands up.  She denies any true dizziness.   Past Medical History Past Medical History:  Diagnosis Date   Anxiety    Arthritis    Asthma    Cancer (HCC)    Cataract    Cholecystitis    Chronic migraine without aura    Degenerative disc disease at L5-S1 level    Depression    GERD (gastroesophageal reflux disease)    Glaucoma    Hiatal hernia    Hyperlipidemia    Irritable bowel    Neuromuscular disorder (HCC)    Osteoarthritis    Osteopenia    Scoliosis    Patient Active Problem List   Diagnosis Date Noted   Essential hypertension 06/30/2022   Eczema 06/30/2022   Venous insufficiency 06/30/2022   Nuclear sclerotic cataract of both eyes 02/18/2016   Regular astigmatism of both eyes 01/03/2016   Presbyopia of both eyes 01/03/2016   Myopia of both eyes 01/03/2016   Keratoconjunctivitis sicca of both eyes not specified as Sjogren's 01/03/2016   Exudative age-related macular degeneration  (HCC) 07/08/2015   Idiopathic scoliosis of thoracolumbar spine 02/16/2015   Degenerative disc disease, lumbar 02/16/2015   Decreased peripheral vision of left eye 01/04/2015   Short-segment Barrett's esophagus 03/03/2014   Sciatica 03/03/2014   Primary osteoarthritis of right knee 03/03/2014   Primary open-angle glaucoma 03/03/2014   Other headache syndrome 03/03/2014   Ischemic optic neuropathy 03/03/2014   Irritable bowel syndrome 03/03/2014   Gastro-esophageal reflux disease with esophagitis 03/03/2014   Functional dyspepsia 03/03/2014   Depression 03/03/2014   Chronic nausea 03/03/2014   Chronic migraine without aura 03/03/2014   Allergic rhinitis 03/03/2014   Hyperlipidemia LDL goal <130 12/23/2013   Glaucoma 12/23/2013   Asthma 12/23/2013   Anxiety 12/23/2013   Home Medication(s) Prior to Admission medications  Medication Sig Start Date End Date Taking? Authorizing Provider  acetaminophen  (TYLENOL ) 500 MG tablet Take 500 mg by mouth 2 (two) times daily as needed for moderate pain or headache.     [provider]  albuterol  (VENTOLIN  HFA) 108 (90 Base) MCG/ACT inhaler Inhale 2 puffs into the lungs every 6 (six) hours as needed for wheezing or shortness of breath. 10/06/23   Thedora Garnette HERO, MD  butalbital -acetaminophen -caffeine  (FIORICET ) 50-325-40 MG tablet Take 1 tablet by mouth daily. 10/06/23   Thedora Garnette HERO, MD  diazepam  (VALIUM ) 5 MG tablet Take 0.5 tablets (2.5 mg total) by mouth daily as needed for anxiety. 10/06/23   Thedora Garnette HERO, MD  diclofenac Sodium (VOLTAREN) 1 % GEL Apply 4 g topically 3 (three) times daily as needed. 01/16/24   [provider]  dicyclomine  (BENTYL ) 10 MG capsule Take 2 capsules (20 mg total) by mouth 2 (two) times daily before a meal. 10/06/23   Thedora Garnette HERO, MD  latanoprost  (XALATAN ) 0.005 % ophthalmic solution Place 1 drop into both eyes at bedtime. 10/06/23   Thedora Garnette HERO, MD  ondansetron  (ZOFRAN -ODT) 4 MG disintegrating  tablet Take 4 mg by mouth 2 (two) times daily as needed. 02/24/24   [provider]  pantoprazole  (PROTONIX ) 40 MG tablet Take 1 tablet (40 mg total) by mouth daily. Patient needs follow up appointment for future refills. Please call [phone] to schedule an appointment. 10/06/23   Thedora Garnette HERO, MD  triamcinolone  cream (KENALOG ) 0.1 % Apply 1 Application topically 2 (two) times daily. Patient taking differently: Apply 1 Application topically as needed. 06/30/22   Thedora Garnette HERO, MD                                                                                                                                    Past Surgical History Past Surgical History:  Procedure Laterality Date   CHOLECYSTECTOMY     LEG SKIN LESION  BIOPSY / EXCISION     TONSILECTOMY, ADENOIDECTOMY, BILATERAL MYRINGOTOMY AND TUBES     TRABECULECTOMY Left    Family History Family History  Problem Relation Age of Onset   Diabetes Mother    Stroke Mother    Alcohol abuse Father    Heart disease Father    Heart disease Sister    Diabetes Sister    Heart disease Brother    Diabetes Brother    Heart disease Brother    Diabetes Brother    Colon cancer Neg Hx    Esophageal cancer Neg Hx    Rectal cancer Neg Hx    Stomach cancer Neg Hx     Social History Social History[1] Allergies Brimonidine, Dorzolamide, Lisinopril , and Erythromycin  Review of Systems Review of Systems  Physical Exam Vital Signs  I have reviewed the triage vital signs BP (!) 160/85 (BP Location: Left Arm)   Pulse 90   Temp 98.2 F (36.8 C) (Oral)   Resp (!) 22   SpO2 100%   Physical Exam Vitals and nursing note reviewed.  HENT:     Head: Normocephalic.     Mouth/Throat:     Mouth: Mucous membranes are dry.  Eyes:     Pupils: Pupils are equal, round, and reactive to light.  Cardiovascular:     Rate and Rhythm: Normal rate.     Pulses: Normal pulses.  Pulmonary:     Effort: Pulmonary effort is normal.  Abdominal:  General: Abdomen is flat.     Palpations: Abdomen is soft.  Musculoskeletal:     Comments: Patient with scabbing to both kneecaps, no deformity or tenderness.  Patient with chronic pain in her right hip.  Neurological:     General: No focal deficit present.     Mental Status: She is alert and oriented to person, place, and time.     Cranial Nerves: No cranial nerve deficit.     Sensory: No sensory deficit.     Motor: No weakness.     ED Results and Treatments Labs (all labs ordered are listed, but only abnormal results are displayed) Labs Reviewed  COMPREHENSIVE METABOLIC PANEL WITH GFR - Abnormal; Notable for the following components:      Result Value   Potassium 5.5 (*)    Glucose, Bld 102 (*)    AST 57 (*)    All other components within normal limits  TROPONIN T, HIGH SENSITIVITY - Abnormal; Notable for the following components:   Troponin T High Sensitivity 22 (*)    All other components within normal limits  CBC  CK  URINALYSIS, ROUTINE W REFLEX MICROSCOPIC  CBG MONITORING, ED  TROPONIN T, HIGH SENSITIVITY                                                                                                                          Radiology DG Knee Complete 4 Views Left Result Date: 05/27/2024 EXAM: 4 OR MORE VIEW(S) XRAY OF THE LEFT KNEE 05/27/2024 03:16:00 PM COMPARISON: None available. CLINICAL HISTORY: trauma Trauma. FINDINGS: BONES AND JOINTS: No acute fracture or dislocation of the left knee. No malalignment. No significant joint effusion. There is a lucent focus along the medial aspect of the left knee which appears to abut the articular surface demonstrating thin sclerotic margin and possible internal septations. This finding could reflect a neoplastic process. Location could reflect giant cell tumor; however, given the patient's age, metastasis or myeloma must be considered. Recommend a nonemergent contrast enhanced MRI of the knee for further evaluation. Mild nodal  compartment joint space narrowing with marginal osteophytosis. Additional subtle osteophytosis along the medial compartment. Mild patellofemoral osteophytosis noted. SOFT TISSUES: Unremarkable. IMPRESSION: 1. No acute fracture or dislocation. 2. Moderate left knee osteoarthritis, lateral compartment predominant. 3. Lucent focus along the medial aspect of the left knee abutting the articular surface, with thin sclerotic margin and possible internal septations, concerning for a neoplastic process. Recommend nonemergent contrast-enhanced MRI of the knee for further evaluation. Electronically signed by: Donnice Mania MD 05/27/2024 03:59 PM EST RP Workstation: [id number]   DG Knee Complete 4 Views Right Result Date: 05/27/2024 CLINICAL DATA:  Fall and pain. EXAM: RIGHT KNEE - COMPLETE 4+ VIEW COMPARISON:  None Available. FINDINGS: No acute fracture or dislocation. Moderate to marked medial compartment joint space narrowing and subchondral sclerosis. Mild lateral and patellofemoral compartment joint space narrowing. No joint effusion. IMPRESSION: Degenerative change, without acute osseous finding. Electronically Signed   By: Rockey  Marthann M.D.   On: 05/27/2024 15:51   DG Hip Unilat With Pelvis 2-3 Views Right Result Date: 05/27/2024 CLINICAL DATA:  trauma. EXAM: DG HIP (WITH OR WITHOUT PELVIS) 2-3V RIGHT COMPARISON:  None Available. FINDINGS: There is diffuse osteopenia of the visualized osseous structures. Pelvis is intact with normal and symmetric sacroiliac joints. No acute fracture or dislocation. No aggressive osseous lesion. Visualized sacral arcuate lines are unremarkable. There are changes of chronic pubic symphisitis. There are mild degenerative changes of bilateral hip joints without significant joint space narrowing. Osteophytosis of the superior acetabulum. No radiopaque foreign bodies. IMPRESSION: No acute osseous abnormality of the pelvis or right hip joint. Electronically Signed   By: Ree Molt M.D.    On: 05/27/2024 15:42   CT Head Wo Contrast Result Date: 05/27/2024 EXAM: CT HEAD WITHOUT CONTRAST 05/27/2024 03:22:14 PM TECHNIQUE: CT of the head was performed without the administration of intravenous contrast. Automated exposure control, iterative reconstruction, and/or weight based adjustment of the mA/kV was utilized to reduce the radiation dose to as low as reasonably achievable. COMPARISON: None available. CLINICAL HISTORY: Ataxia, head trauma. FINDINGS: BRAIN AND VENTRICLES: No acute hemorrhage. No evidence of acute infarct. Moderate chronic microvascular ischemic changes. Mild cerebral volume loss for patient's age. Remote lacunar infarcts in the right basal ganglia. Atherosclerosis of the carotid siphons and intracranial vertebral arteries. No hydrocephalus. No extra-axial collection. No mass effect or midline shift. SINUSES: Mucosal thickening in the left ethmoid sinus. SOFT TISSUES AND SKULL: Right lens replacement. No acute soft tissue abnormality. No skull fracture. IMPRESSION: 1. No acute intracranial abnormality. 2. Moderate chronic microvascular ischemic changes and mild cerebral volume loss for age. 3. Remote lacunar infarcts in the right basal ganglia. Electronically signed by: Donnice Mania MD 05/27/2024 03:41 PM EST RP Workstation: [id number]    Pertinent labs & imaging results that were available during my care of the patient were reviewed by me and considered in my medical decision making (see MDM for details).  Medications Ordered in ED Medications  sodium zirconium cyclosilicate  (LOKELMA ) packet 5 g (has no administration in time range)  sodium chloride  0.9 % bolus 1,000 mL (1,000 mLs Intravenous New Bag/Given 05/27/24 1540)                                                                                                                                     Procedures Procedures  (including critical care time)  Medical Decision Making / ED Course   This patient presents to  the ED for concern of syncope, this involves an extensive number of treatment options, and is a complaint that carries with it a high risk of complications and morbidity.  The differential diagnosis includes syncope, dehydration, rhabdomyolysis, deconditioning, ICH.  MDM: I do have concern that the patient may have had a cardiogenic syncopal event given her report of sudden onset loss of consciousness.  Will obtain EKG, troponin and other blood work.  Will obtain imaging of the patient's head to assess for traumatic injury.  Will obtain CK to assess for rhabdomyolysis, urinalysis ordered.  Patient looks very good for someone who reportedly spent 4 days on the ground.  Reassessment at 4:05 PM-patient's imaging negative for acute process.  Mild hyperkalemia, provided some fluids and Lokelma .  Troponin mildly elevated at 22, however her EKG is nonischemic.  Given time since episode, do not believe this represents ACS.  Will admit to hospitalist for syncope.   Additional history obtained: -Additional history obtained from son at bedside -External records from outside source obtained and reviewed including: Chart review including previous notes, labs, imaging, consultation notes   Lab Tests: -I ordered, reviewed, and interpreted labs.   The pertinent results include:   Labs Reviewed  COMPREHENSIVE METABOLIC PANEL WITH GFR - Abnormal; Notable for the following components:      Result Value   Potassium 5.5 (*)    Glucose, Bld 102 (*)    AST 57 (*)    All other components within normal limits  TROPONIN T, HIGH SENSITIVITY - Abnormal; Notable for the following components:   Troponin T High Sensitivity 22 (*)    All other components within normal limits  CBC  CK  URINALYSIS, ROUTINE W REFLEX MICROSCOPIC  CBG MONITORING, ED  TROPONIN T, HIGH SENSITIVITY      EKG insert normal EKG, PACs.  EKG Interpretation Date/Time:  Friday May 27 2024 14:23:49 EST Ventricular Rate:  87 PR  Interval:  138 QRS Duration:  85 QT Interval:  354 QTC Calculation: 426 R Axis:   13  Text Interpretation: Sinus rhythm Atrial premature complex Confirmed by Mannie Pac [phone]) on 05/27/2024 4:10:36 PM         Imaging Studies ordered: I ordered imaging studies including imaging head, plain films of pelvis, bilateral knees I independently visualized and interpreted imaging. I agree with the radiologist interpretation   Medicines ordered and prescription drug management: Meds ordered this encounter  Medications   sodium chloride  0.9 % bolus 1,000 mL   sodium zirconium cyclosilicate  (LOKELMA ) packet 5 g    -I have reviewed the patients home medicines and have made adjustments as needed   Cardiac Monitoring: The patient was maintained on a cardiac monitor.  I personally viewed and interpreted the cardiac monitored which showed an underlying rhythm of: Normal sinus rhythm  Social Determinants of Health:  Factors impacting patients care include: Access to primary care   Reevaluation: After the interventions noted above, I reevaluated the patient and found that they have :improved  Co morbidities that complicate the patient evaluation  Past Medical History:  Diagnosis Date   Anxiety    Arthritis    Asthma    Cancer (HCC)    Cataract    Cholecystitis    Chronic migraine without aura    Degenerative disc disease at L5-S1 level    Depression    GERD (gastroesophageal reflux disease)    Glaucoma    Hiatal hernia    Hyperlipidemia    Irritable bowel    Neuromuscular disorder (HCC)    Osteoarthritis    Osteopenia    Scoliosis          Final Clinical Impression(s) / ED Diagnoses Final diagnoses:  Syncope, unspecified syncope type     @PCDICTATION @     [1]  Social History Tobacco Use   Smoking status: Never   Smokeless tobacco: Never  Vaping Use   Vaping status: Never Used  Substance Use  Topics   Alcohol use: Never   Drug use: Never      Mannie Pac T, DO 05/27/24 1612  "

## 2024-08-19 ENCOUNTER — Ambulatory Visit
